# Patient Record
Sex: Female | Born: 1995
Health system: Southern US, Community
[De-identification: ages and names within clinical notes are randomized; demographics above are authoritative.]

## PROBLEM LIST (undated history)

## (undated) DIAGNOSIS — N939 Abnormal uterine and vaginal bleeding, unspecified: Principal | ICD-10-CM

## (undated) DIAGNOSIS — F32A Depression, unspecified: Secondary | ICD-10-CM

## (undated) DIAGNOSIS — B009 Herpesviral infection, unspecified: Secondary | ICD-10-CM

## (undated) DIAGNOSIS — F329 Major depressive disorder, single episode, unspecified: Secondary | ICD-10-CM

## (undated) DIAGNOSIS — R519 Headache, unspecified: Secondary | ICD-10-CM

## (undated) DIAGNOSIS — L209 Atopic dermatitis, unspecified: Secondary | ICD-10-CM

## (undated) HISTORY — DX: Atopic dermatitis, unspecified: L20.9

## (undated) HISTORY — DX: Headache, unspecified: R51.9

## (undated) HISTORY — DX: Herpesviral infection, unspecified: B00.9

## (undated) HISTORY — DX: Abnormal uterine and vaginal bleeding, unspecified: N93.9

---
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
## 2016-01-02 ENCOUNTER — Telehealth (HOSPITAL_COMMUNITY): Payer: Self-pay | Admitting: *Deleted

## 2016-02-10 ENCOUNTER — Ambulatory Visit (HOSPITAL_COMMUNITY): Payer: Self-pay | Admitting: Psychiatry

## 2016-03-04 ENCOUNTER — Encounter (HOSPITAL_COMMUNITY): Payer: Self-pay | Admitting: Psychiatry

## 2016-03-04 ENCOUNTER — Ambulatory Visit (INDEPENDENT_AMBULATORY_CARE_PROVIDER_SITE_OTHER): Payer: 59 | Admitting: Psychiatry

## 2016-03-04 DIAGNOSIS — F429 Obsessive-compulsive disorder, unspecified: Secondary | ICD-10-CM

## 2016-03-04 DIAGNOSIS — F411 Generalized anxiety disorder: Secondary | ICD-10-CM | POA: Diagnosis not present

## 2016-03-04 NOTE — Patient Instructions (Signed)
Discussed orally 
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
## 2016-03-10 ENCOUNTER — Encounter (HOSPITAL_COMMUNITY): Payer: Self-pay | Admitting: Psychiatry

## 2016-03-10 ENCOUNTER — Ambulatory Visit (INDEPENDENT_AMBULATORY_CARE_PROVIDER_SITE_OTHER): Payer: 59 | Admitting: Psychiatry

## 2016-03-10 DIAGNOSIS — F429 Obsessive-compulsive disorder, unspecified: Secondary | ICD-10-CM | POA: Diagnosis not present

## 2016-03-10 DIAGNOSIS — F411 Generalized anxiety disorder: Secondary | ICD-10-CM | POA: Diagnosis not present

## 2016-03-10 NOTE — Patient Instructions (Signed)
Discussed orally 
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
## 2016-03-11 ENCOUNTER — Encounter: Payer: Self-pay | Admitting: Family Medicine

## 2016-03-11 ENCOUNTER — Ambulatory Visit (INDEPENDENT_AMBULATORY_CARE_PROVIDER_SITE_OTHER): Payer: 59 | Admitting: Family Medicine

## 2016-03-11 VITALS — BP 118/78 | Ht 63.0 in | Wt 140.8 lb

## 2016-03-11 DIAGNOSIS — S060X1A Concussion with loss of consciousness of 30 minutes or less, initial encounter: Secondary | ICD-10-CM

## 2016-03-11 NOTE — Progress Notes (Signed)
   Subjective:    Patient ID: Brianna Banks, female    DOB: 05/30/1996, 20 y.o.   MRN: [id number]  HPI  Patient arrives after passing out and hitting her head after giving blood-year-old Patient states she gave blood. After doing so she went to the post office. She started finding herself feeling dizzy and lightheaded. In addition to this she felt hot and sweaty all over before she passed out. Before she could sit down she passed out striking her head against the door. She was out briefly. She has had some mild headache associated with the contusion. She denies global headache. She denies nausea or vomiting. She denies double vision. No unilateral numbness or weakness. Denies any seizure. Has never had this happen before but has had times where she is felt hot and sweaty and felt dizzy Review of Systems    see above. Denies fevers chills or recent illness. Family history noncontributory. Objective:   Physical Exam  Pupils responsive to light eardrums normal no blood behind eardrums throat is normal neck no masses neurologic finger to nose normal Romberg negative EOMI. Cranial nerves II through XII normal. Left posterior scalp has small hematoma. Neck no masses. No unilateral numbness or weakness      Assessment & Plan:  Concussion with loss of consciousness-patient fell hitting her head had loss consciousness that was brief patient with mild headache she was advised not to drive until headache resolves she was given work note If any vomiting double vision blurred vision or persistent headache go to ER Patient was encouraged to have her mother wake her up every 2 hours mother was present she is aware of this wake her up every 2 hours make sure that she has good mental focus. If any problems go to ER

## 2016-03-12 ENCOUNTER — Other Ambulatory Visit: Payer: Self-pay | Admitting: Nurse Practitioner

## 2016-03-19 ENCOUNTER — Ambulatory Visit (HOSPITAL_COMMUNITY): Payer: Self-pay | Admitting: Psychiatry

## 2016-03-26 ENCOUNTER — Ambulatory Visit (HOSPITAL_COMMUNITY): Payer: Self-pay | Admitting: Psychiatry

## 2016-04-02 ENCOUNTER — Ambulatory Visit (HOSPITAL_COMMUNITY): Payer: Self-pay | Admitting: Psychiatry

## 2016-04-07 ENCOUNTER — Telehealth: Payer: Self-pay | Admitting: Family Medicine

## 2016-04-07 ENCOUNTER — Encounter (HOSPITAL_COMMUNITY): Payer: Self-pay | Admitting: Psychiatry

## 2016-04-07 NOTE — Telephone Encounter (Signed)
Shot record printed.

## 2016-04-07 NOTE — Telephone Encounter (Signed)
Pt is needing a copy of her immunization record.

## 2016-05-22 ENCOUNTER — Ambulatory Visit (INDEPENDENT_AMBULATORY_CARE_PROVIDER_SITE_OTHER): Payer: 59 | Admitting: Nurse Practitioner

## 2016-05-22 ENCOUNTER — Encounter: Payer: Self-pay | Admitting: Nurse Practitioner

## 2016-05-22 VITALS — BP 114/74 | Ht 63.0 in | Wt 140.2 lb

## 2016-05-22 DIAGNOSIS — Z3009 Encounter for other general counseling and advice on contraception: Secondary | ICD-10-CM

## 2016-05-22 DIAGNOSIS — Z309 Encounter for contraceptive management, unspecified: Secondary | ICD-10-CM | POA: Diagnosis not present

## 2016-05-22 DIAGNOSIS — Z113 Encounter for screening for infections with a predominantly sexual mode of transmission: Secondary | ICD-10-CM

## 2016-05-22 DIAGNOSIS — Z23 Encounter for immunization: Secondary | ICD-10-CM | POA: Diagnosis not present

## 2016-05-22 MED ORDER — ETONOGESTREL-ETHINYL ESTRADIOL 0.12-0.015 MG/24HR VA RING: VAGINAL_RING | VAGINAL | 12 refills | Status: DC

## 2016-05-23 ENCOUNTER — Encounter: Payer: Self-pay | Admitting: Nurse Practitioner

## 2016-05-23 NOTE — Progress Notes (Signed)
Subjective:  Presents to discuss contraceptive options. Has trouble remembering oc's. Would like to try Nuvaring. Has a new sexual partner. Uses condoms but not consistently. No fever, pelvic pain or discharge.   Objective:   BP 114/74   Ht 5\' 3"  (1.6 m)   Wt 140 lb 4 oz (63.6 kg)   BMI 24.84 kg/m  NAD. Alert, oriented. Lungs clear. Heart RRR.   Assessment: Encounter for counseling regarding initiation of other contraceptive measure  Screen for STD (sexually transmitted disease) - Plan: Chlamydia/Gonococcus/Trichomonas, NAA  Need for vaccination - Plan: Flu Vaccine QUAD 36+ mos PF IM (Fluarix & Fluzone Quad PF)  Plan:  Meds ordered this encounter  Medications  . etonogestrel-ethinyl estradiol (NUVARING) 0.12-0.015 MG/24HR vaginal ring    Sig: Insert vaginally once a month.    Dispense:  1 each    Refill:  12    Order Specific Question:   Supervising Provider    Answer:   Merlyn AlbertLUKING, WILLIAM S [[id number]]   Discussed safe sex issues. Call back if any issues with Nuvaring. Patient has used in the past without difficulty.

## 2016-05-25 ENCOUNTER — Encounter: Payer: Self-pay | Admitting: Family Medicine

## 2016-05-25 ENCOUNTER — Encounter: Payer: Self-pay | Admitting: Nurse Practitioner

## 2016-05-26 LAB — CHLAMYDIA/GONOCOCCUS/TRICHOMONAS, NAA
Chlamydia by NAA: POSITIVE — AB
Gonococcus by NAA: NEGATIVE
Trich vag by NAA: NEGATIVE

## 2016-05-27 ENCOUNTER — Other Ambulatory Visit: Payer: Self-pay | Admitting: Nurse Practitioner

## 2016-05-27 ENCOUNTER — Encounter: Payer: Self-pay | Admitting: Nurse Practitioner

## 2016-05-27 MED ORDER — AZITHROMYCIN 250 MG PO TABS: ORAL_TABLET | ORAL | 0 refills | Status: DC

## 2016-05-28 ENCOUNTER — Encounter: Payer: Self-pay | Admitting: Nurse Practitioner

## 2016-05-29 ENCOUNTER — Encounter: Payer: Self-pay | Admitting: Nurse Practitioner

## 2016-05-31 ENCOUNTER — Encounter: Payer: Self-pay | Admitting: Nurse Practitioner

## 2016-06-02 ENCOUNTER — Encounter: Payer: Self-pay | Admitting: Nurse Practitioner

## 2016-06-02 ENCOUNTER — Other Ambulatory Visit: Payer: Self-pay | Admitting: Nurse Practitioner

## 2016-06-02 DIAGNOSIS — Z113 Encounter for screening for infections with a predominantly sexual mode of transmission: Secondary | ICD-10-CM

## 2016-06-04 ENCOUNTER — Other Ambulatory Visit: Payer: Self-pay | Admitting: Nurse Practitioner

## 2016-06-06 LAB — GC/CHLAMYDIA PROBE AMP
Chlamydia trachomatis, NAA: NEGATIVE
Neisseria gonorrhoeae by PCR: NEGATIVE

## 2016-06-08 ENCOUNTER — Encounter: Payer: Self-pay | Admitting: Nurse Practitioner

## 2016-06-10 ENCOUNTER — Encounter: Payer: Self-pay | Admitting: Nurse Practitioner

## 2016-06-10 ENCOUNTER — Other Ambulatory Visit: Payer: Self-pay | Admitting: Nurse Practitioner

## 2016-06-10 MED ORDER — METRONIDAZOLE 500 MG PO TABS: 500.0000 mg | ORAL_TABLET | Freq: Two times a day (BID) | ORAL | 0 refills | Status: DC

## 2016-06-10 NOTE — Telephone Encounter (Signed)
The results are in for the GC/CT probe done on 06/04/16

## 2016-06-22 ENCOUNTER — Encounter: Payer: Self-pay | Admitting: Nurse Practitioner

## 2016-06-22 NOTE — Telephone Encounter (Signed)
Please give the patient a school note as requested and work note

## 2016-06-23 ENCOUNTER — Encounter: Payer: Self-pay | Admitting: Family Medicine

## 2016-06-23 NOTE — Telephone Encounter (Signed)
Nurse's-the patient may have a school note. Also if she needs Zofran please send in Zofran 8 mg tablet one 3 times a day when necessary nausea #12 with one refill, if she needs ongoing medicine for nausea she should get the nausea checked out

## 2016-06-24 ENCOUNTER — Encounter: Payer: Self-pay | Admitting: Adult Health

## 2016-09-16 ENCOUNTER — Ambulatory Visit: Payer: 59 | Admitting: Nurse Practitioner

## 2016-09-25 ENCOUNTER — Encounter: Payer: Self-pay | Admitting: Nurse Practitioner

## 2016-09-28 ENCOUNTER — Encounter: Payer: Self-pay | Admitting: Family Medicine

## 2016-10-06 ENCOUNTER — Encounter: Payer: Self-pay | Admitting: Family Medicine

## 2016-10-08 ENCOUNTER — Encounter: Payer: Self-pay | Admitting: Nurse Practitioner

## 2016-10-15 ENCOUNTER — Ambulatory Visit (INDEPENDENT_AMBULATORY_CARE_PROVIDER_SITE_OTHER): Payer: 59 | Admitting: Family Medicine

## 2016-10-15 ENCOUNTER — Encounter: Payer: Self-pay | Admitting: Family Medicine

## 2016-10-15 VITALS — BP 118/78 | Ht 63.0 in | Wt 121.5 lb

## 2016-10-15 DIAGNOSIS — F411 Generalized anxiety disorder: Secondary | ICD-10-CM | POA: Diagnosis not present

## 2016-10-15 DIAGNOSIS — R634 Abnormal weight loss: Secondary | ICD-10-CM

## 2016-10-15 DIAGNOSIS — I951 Orthostatic hypotension: Secondary | ICD-10-CM

## 2016-10-15 MED ORDER — BUSPIRONE HCL 5 MG PO TABS: 5.0000 mg | ORAL_TABLET | Freq: Two times a day (BID) | ORAL | 2 refills | Status: DC

## 2016-10-15 NOTE — Progress Notes (Signed)
Subjective:    Patient ID: Brianna Banks, female    DOB: Sep 15, 1995, 21 y.o.   MRN: [id number]  HPI Patient in today with c/o dizzy spells , and passing out. Also has c/o decreased appetite.  States onset 2 weeks ago.  #1-patient has significant loss of weight over the past couple months. She admits to not eating as well as she should. She states she finds herself feeling stressed and anxious a lot. She denies feeling depressed. She works 20 hours a week she goes to school. She does have a guy that she talks to a lot but she is not dating anyone and she states she doesn't have many friends. She finds himself feeling anxious throughout the day for no particular reason but denies any panic attacks and states often when she tries to eat things she finds himself feeling nauseous after a few minutes and we'll stop eating. Over the past few weeks she is noticed that she's been getting weak when she stands up  #2-weakness when she stands up with intermittent dizzy spells feels like to room is closing in on her feels dizzy she states once again she does not eat and drink as much as she should she denies any severe headaches wheezing difficulty breathing denies vomiting does have some intermittent nausea. Denies any unilateral numbness weakness.  #3 syncope she did pass out once at home she found herself laying on the ground after she stood up she wasn't quite sure what causes that she denies any other particular troubles  PMH patient states she's been under a fair amount of stress. She also states at times she felt like she was overweight so she try to lose some weight  We did talk at length about eating habits it does not sound like patient has anorexia but does sound when she has subpar eating habits and recently the anxiousness is cause significant eating issues States no other concerns this visit.    Review of Systems Patient denies chest tightness pressure pain shortness of breath she does  relate some dizziness denies unilateral numbness weakness no vomiting or diarrhea but does have nausea    Objective:   Physical Exam  Patient does not appear toxic eardrums are normal throat is normal neck no masses lungs are clear no crackles heart is regular abdomen soft no guarding rebound blood pressure laying sitting standing does show some drop with standing but otherwise normal neurologic grossly normal      Assessment & Plan:  Weight loss-patient is having poor nutrition we will check some lab work she does need do a better job of eating I believe that her emotions and anxiety are coming into play and causing part of this I do not find evidence of depression I counseled the patient about importance of eating healthier and eating more frequently in larger quantities and we will need to see her back in 4-6 weeks  Orthostatic hypotension she ought to use some salt with her diet eat more drink more this should correct itself  Syncope related to orthostatic hypotension no intervention necessary no scans or MRI is necessary currently  Generalized anxiety disorder patient states that Zoloft causes nausea I don't recommend SSRI because of the nausea side effect plus also the risk of suicidal ideation I would recommend using low-dose BuSpar 5 mg 1 twice a day this is non-habit forming. Also recommended patient do counseling and have discussed this with her. The patient defers on counseling we will put together  some information and send to her regarding some self-help books.  I told the patient if she changes her mind regarding counseling p we will be happy to help set this up

## 2016-10-16 ENCOUNTER — Encounter: Payer: Self-pay | Admitting: Family Medicine

## 2016-10-16 LAB — HEPATIC FUNCTION PANEL
ALT: 25 IU/L (ref 0–32)
AST: 20 IU/L (ref 0–40)
Albumin: 4.3 g/dL (ref 3.5–5.5)
Alkaline Phosphatase: 56 IU/L (ref 39–117)
Bilirubin Total: 0.3 mg/dL (ref 0.0–1.2)
Bilirubin, Direct: 0.07 mg/dL (ref 0.00–0.40)
Total Protein: 8.1 g/dL (ref 6.0–8.5)

## 2016-10-16 LAB — CBC WITH DIFFERENTIAL/PLATELET
Basophils Absolute: 0 10*3/uL (ref 0.0–0.2)
Basos: 1 %
EOS (ABSOLUTE): 0.1 10*3/uL (ref 0.0–0.4)
Eos: 2 %
Hematocrit: 40.3 % (ref 34.0–46.6)
Hemoglobin: 12.7 g/dL (ref 11.1–15.9)
Immature Grans (Abs): 0 10*3/uL (ref 0.0–0.1)
Immature Granulocytes: 0 %
Lymphocytes Absolute: 2 10*3/uL (ref 0.7–3.1)
Lymphs: 29 %
MCH: 25.3 pg — ABNORMAL LOW (ref 26.6–33.0)
MCHC: 31.5 g/dL (ref 31.5–35.7)
MCV: 80 fL (ref 79–97)
Monocytes Absolute: 0.6 10*3/uL (ref 0.1–0.9)
Monocytes: 9 %
Neutrophils Absolute: 3.9 10*3/uL (ref 1.4–7.0)
Neutrophils: 59 %
Platelets: 234 10*3/uL (ref 150–379)
RBC: 5.01 x10E6/uL (ref 3.77–5.28)
RDW: 17.1 % — ABNORMAL HIGH (ref 12.3–15.4)
WBC: 6.7 10*3/uL (ref 3.4–10.8)

## 2016-10-16 LAB — BASIC METABOLIC PANEL
BUN/Creatinine Ratio: 11 (ref 9–23)
BUN: 8 mg/dL (ref 6–20)
CO2: 21 mmol/L (ref 18–29)
Calcium: 10 mg/dL (ref 8.7–10.2)
Chloride: 100 mmol/L (ref 96–106)
Creatinine, Ser: 0.75 mg/dL (ref 0.57–1.00)
GFR calc Af Amer: 134 (ref >59)
GFR calc non Af Amer: 116 (ref >59)
Glucose: 83 mg/dL (ref 65–99)
Potassium: 4.8 mmol/L (ref 3.5–5.2)
Sodium: 139 mmol/L (ref 134–144)

## 2016-10-16 LAB — PREALBUMIN: PREALBUMIN: 26 mg/dL (ref 14–35)

## 2016-10-30 ENCOUNTER — Emergency Department (HOSPITAL_COMMUNITY)
Admission: EM | Admit: 2016-10-30 | Discharge: 2016-10-31 | Disposition: A | Discharge status: Home / Self Care | Payer: 59 | Attending: Emergency Medicine | Admitting: Emergency Medicine

## 2016-10-30 ENCOUNTER — Encounter (HOSPITAL_COMMUNITY): Payer: Self-pay | Admitting: Emergency Medicine

## 2016-10-30 DIAGNOSIS — N390 Urinary tract infection, site not specified: Secondary | ICD-10-CM

## 2016-10-30 DIAGNOSIS — F329 Major depressive disorder, single episode, unspecified: Secondary | ICD-10-CM | POA: Diagnosis not present

## 2016-10-30 DIAGNOSIS — F32A Depression, unspecified: Secondary | ICD-10-CM

## 2016-10-30 DIAGNOSIS — Z79899 Other long term (current) drug therapy: Secondary | ICD-10-CM | POA: Diagnosis not present

## 2016-10-30 HISTORY — DX: Depression, unspecified: F32.A

## 2016-10-30 HISTORY — DX: Major depressive disorder, single episode, unspecified: F32.9

## 2016-10-30 LAB — CBC WITH DIFFERENTIAL/PLATELET
Basophils Absolute: 0.1 10*3/uL (ref 0.0–0.1)
Basophils Relative: 1 %
Eosinophils Absolute: 0 10*3/uL (ref 0.0–0.7)
Eosinophils Relative: 1 %
HCT: 39.4 % (ref 36.0–46.0)
Hemoglobin: 12.5 g/dL (ref 12.0–15.0)
Lymphocytes Relative: 27 %
Lymphs Abs: 2.2 10*3/uL (ref 0.7–4.0)
MCH: 26 pg (ref 26.0–34.0)
MCHC: 31.7 g/dL (ref 30.0–36.0)
MCV: 82.1 fL (ref 78.0–100.0)
Monocytes Absolute: 0.8 10*3/uL (ref 0.1–1.0)
Monocytes Relative: 10 %
Neutro Abs: 5 10*3/uL (ref 1.7–7.7)
Neutrophils Relative %: 62 %
Platelets: 158 10*3/uL (ref 150–400)
RBC: 4.8 MIL/uL (ref 3.87–5.11)
RDW: 16.5 % — ABNORMAL HIGH (ref 11.5–15.5)
WBC: 8.1 10*3/uL (ref 4.0–10.5)

## 2016-10-30 LAB — BASIC METABOLIC PANEL
Anion gap: 9 (ref 5–15)
BUN: 11 mg/dL (ref 6–20)
CO2: 25 mmol/L (ref 22–32)
Calcium: 9.3 mg/dL (ref 8.9–10.3)
Chloride: 104 mmol/L (ref 101–111)
Creatinine, Ser: 0.97 mg/dL (ref 0.44–1.00)
GFR calc Af Amer: >60 mL/min (ref >60)
GFR calc non Af Amer: >60 mL/min (ref >60)
Glucose, Bld: 75 mg/dL (ref 65–99)
Potassium: 3.6 mmol/L (ref 3.5–5.1)
Sodium: 138 mmol/L (ref 135–145)

## 2016-10-30 LAB — ETHANOL: Alcohol, Ethyl (B): <5 mg/dL (ref <5)

## 2016-10-30 NOTE — ED Triage Notes (Addendum)
Pt has dx of depression.  Rx'd buspar feb 22.  Pt has increased depression past week.  Lays in bed all day.  Will not eat or drink.  Has lost 20lbs in past month.  Denies si/hi

## 2016-10-30 NOTE — BH Assessment (Signed)
Pt has been recommended for d/c and to f/u with OPT resources. Spoke with Linda HedgesValinde and provided disposition recommendation and resources were sent to fax [phone] as requested.  Princess BruinsAquicha Duff, MSW, Theresia MajorsLCSWA

## 2016-10-30 NOTE — BH Assessment (Signed)
Tele Assessment Note   Brianna Banks is an 21 y.o. female who presents to the ED voluntarily accompanied by her mother. Pt reports she has been feeling depressed and isolated. Pt provided verbal consent for her mother to be present and provide information to the assessor during the assessment. Pt's mother reports the pt lives at home with her and she noticed the pt has been isolating herself, staying in her room, not eating, and states she feels her depression has increased over the past several weeks. Pt reports she feels her depression has increased after ending her relationship with her partner after 7 months. Pt reports her relationship used to be an outlet to her feelings because she had "a distraction" but since they broke up about a week ago, she has not been able to control her emotions.    Pt reports she has lost her appetite and lost 20 lbs recently due to not wanting to eat. Pt's mother reports the pt will eat 1 sandwich every few days. Pt reports she wakes up and lays in the bed for several hours until she gets out of bed. Pt's mother reports the pt has told her "she is not afraid to die and she thinks that death is like an outlet from her reality." Pt has received OPT treatment in the past but did not feel it was helpful because the counselor was suggesting things such as taking a cooking class or a sewing class to cope with her depression.    Pt denies SI or HI and denies AVH. Pt was asked if she has ever been exposed to trauma including any type of abuse, pt looked away and stated "no." During the assessment, pt was observed as biting her lip throughout the assessment. Pt reports she obsessively picks at her skin. Pt stated "if I see like a pimple or a bump on my arm or on someone else's body I have to pick at it and if they don't let me I get mad." Pt states she has sometimes done this to the point her body will bleed. Pt states she shakes her legs often and when she gets upset she bites  her lips and her body shakes uncontrollably. Pt denies any current or hx of SA.    Per Nira Conn, NP pt does not meet criteria for inpt treatment and recommends OPT referrals.   Diagnosis: Major Depressive D/O; OCD   Past Medical History:  Past Medical History:  Diagnosis Date   Abnormal uterine bleeding (AUB) 03/11/2015   Atopic dermatitis    Depression     Past Surgical History:  Procedure Laterality Date   TONSILECTOMY/ADENOIDECTOMY WITH MYRINGOTOMY  2013    Family History:  Family History  Problem Relation Age of Onset   Arthritis Mother    Other Mother     nerve damage in back   Hypertension Father    Other Father     back pain; nerve damage   Cancer Maternal Aunt    Other Maternal Aunt     horse shoe kidney   Bipolar disorder Maternal Aunt    Depression Maternal Aunt    Cancer Maternal Uncle    Diabetes Paternal Aunt    COPD Paternal Aunt    Arthritis Maternal Grandmother    Hypertension Maternal Grandmother    COPD Maternal Grandmother    Diabetes Maternal Grandmother    Depression Maternal Grandmother    Stroke Maternal Grandmother    Other Maternal Grandmother  restless leg; neuropathy; adrenal gland stopped working; B12 def.   Cancer Maternal Grandfather    Cancer Paternal Grandmother    GER disease Paternal Grandmother    Hypertension Paternal Grandmother    Diabetes Paternal Grandfather    Heart attack Paternal Grandfather    Other Paternal Grandfather     brain aneursym   Depression Maternal Aunt    Diabetes Maternal Aunt     Prediabetes   Other Maternal Aunt     restless leg   Bipolar disorder Maternal Aunt     Social History:  reports that she has never smoked. She has never used smokeless tobacco. She reports that she does not drink alcohol or use drugs.  Additional Social History:  Alcohol / Drug Use Pain Medications: See PTA meds  Prescriptions: See PTA meds  Over the Counter: See PTA meds   History of alcohol / drug use?: No history of alcohol / drug abuse  CIWA: CIWA-Ar BP: 113/72 Pulse Rate: 102 COWS:    PATIENT STRENGTHS: (choose at least two) Average or above average intelligence Capable of independent living Communication skills Financial means General fund of knowledge Motivation for treatment/growth Physical Health Supportive family/friends  Allergies:  Allergies  Allergen Reactions   Aspirin Hives    No ASA based products   Cefprozil Rash    Childhood-Cefzil    Home Medications:  (Not in a hospital admission)  OB/GYN Status:  Patient's last menstrual period was 10/22/2016.  General Assessment Data Location of Assessment: [facility] TTS Assessment: In system Is this a Tele or Face-to-Face Assessment?: Tele Assessment Is this an Initial Assessment or a Re-assessment for this encounter?: Initial Assessment Marital status: Single Is patient pregnant?: No Pregnancy Status: No Living Arrangements: Parent Can pt return to current living arrangement?: Yes Admission Status: Voluntary Is patient capable of signing voluntary admission?: Yes Referral Source: Self/Family/Friend Insurance type: [facility]     Crisis Care Plan Living Arrangements: Parent Name of Psychiatrist: NONE Name of Therapist: NONE  Education Status Is patient currently in school?: Yes Current Grade: sophmore in college  Highest grade of school patient has completed: some college  Name of school: [employer]  Risk to self with the past 6 months Suicidal Ideation: No Has patient been a risk to self within the past 6 months prior to admission? : No Suicidal Intent: No Has patient had any suicidal intent within the past 6 months prior to admission? : No Is patient at risk for suicide?: No Suicidal Plan?: No Has patient had any suicidal plan within the past 6 months prior to admission? : No Access to Means: No What has been your use of drugs/alcohol within the last 12 months?: denies use   Previous Attempts/Gestures: No Triggers for Past Attempts: None known Intentional Self Injurious Behavior: Bruising Comment - Self Injurious Behavior: pt reports she picks at her skin until the point that it bleeds  Family Suicide History: No Recent stressful life event(s): Loss (Comment) (reports she ended a relationship with her boyfriend ) Persecutory voices/beliefs?: No Depression: Yes Depression Symptoms: Despondent, Insomnia, Isolating, Fatigue, Loss of interest in usual pleasures, Feeling worthless/self pity Substance abuse history and/or treatment for substance abuse?: No Suicide prevention information given to non-admitted patients: Not applicable  Risk to Others within the past 6 months Homicidal Ideation: No Does patient have any lifetime risk of violence toward others beyond the six months prior to admission? : No Thoughts of Harm to Others: No Current Homicidal Intent: No Current Homicidal Plan: No  Access to Homicidal Means: No History of harm to others?: No Assessment of Violence: None Noted Does patient have access to weapons?: No Criminal Charges Pending?: No Does patient have a court date: No Is patient on probation?: No  Psychosis Hallucinations: None noted Delusions: None noted  Mental Status Report Appearance/Hygiene: In scrubs, Unremarkable Eye Contact: Good Motor Activity: Freedom of movement Speech: Logical/coherent Level of Consciousness: Alert Mood: Depressed, Sullen, Sad, Helpless Affect: Depressed, Flat, Sad Anxiety Level: None Thought Processes: Coherent, Relevant Judgement: Partial Orientation: Person, Place, Time, Situation, Appropriate for developmental age Obsessive Compulsive Thoughts/Behaviors: Moderate  Cognitive Functioning Concentration: Normal Memory: Recent Intact, Remote Intact IQ: Average Insight: Fair Impulse Control: Good Appetite: Poor Weight Loss: 20 Sleep: Decreased Total Hours of Sleep: 4 Vegetative Symptoms:  Staying in bed  ADLScreening [facility]) Patient's cognitive ability adequate to safely complete daily activities?: Yes Patient able to express need for assistance with ADLs?: Yes Independently performs ADLs?: Yes (appropriate for developmental age)  Prior Inpatient Therapy Prior Inpatient Therapy: No  Prior Outpatient Therapy Prior Outpatient Therapy: Yes Prior Therapy Dates: 2017 Prior Therapy Facilty/Provider(s): Pt unable to recall the name of the OPT provider  Reason for Treatment: MDD Does patient have an ACCT team?: No Does patient have Intensive In-House Services?  : No Does patient have [facility] services? : No Does patient have P4CC services?: No  ADL Screening (condition at time of admission) Patient's cognitive ability adequate to safely complete daily activities?: Yes Is the patient deaf or have difficulty hearing?: No Does the patient have difficulty seeing, even when wearing glasses/contacts?: No Does the patient have difficulty concentrating, remembering, or making decisions?: No Patient able to express need for assistance with ADLs?: Yes Does the patient have difficulty dressing or bathing?: No Independently performs ADLs?: Yes (appropriate for developmental age) Does the patient have difficulty walking or climbing stairs?: No Weakness of Legs: None Weakness of Arms/Hands: None  Home Assistive Devices/Equipment Home Assistive Devices/Equipment: None    Abuse/Neglect Assessment (Assessment to be complete while patient is alone) Physical Abuse: Denies Verbal Abuse: Denies Sexual Abuse: Denies Exploitation of patient/patient's resources: Denies Self-Neglect: Denies     Merchant navy officerAdvance Directives (For Healthcare) Does Patient Have a Medical Advance Directive?: No Would patient like information on creating a medical advance directive?: No - Patient declined    Additional Information 1:1 In Past 12 Months?: No CIRT Risk: No Elopement Risk: No Does  patient have medical clearance?: Yes     Disposition:  Disposition Initial Assessment Completed for this Encounter: Yes Disposition of Patient: Outpatient treatment Type of outpatient treatment: Adult (per Nira ConnJason Berry, NP resources provided )  Brianna Banks 10/30/2016 11:46 PM

## 2016-10-30 NOTE — ED Provider Notes (Signed)
[facility] Provider Note   CSN: [id number] Arrival date & time: 10/30/16  1928     History   Chief Complaint Chief Complaint  Patient presents with  . Depression    HPI Brianna Banks is a 21 y.o. female.  HPI  Pt was seen at 2000. Per pt, c/o gradual onset and worsening of persistent depression for the past month, worse over the past week. Pt states she has been "just laying in bed" and "not eating." PMD rx buspar 10/15/16 for her symptoms. Pt states she does not believe the medication has improved her symptoms. Denies SI, no SA, no HI, no hallucinations.   Past Medical History:  Diagnosis Date  . Abnormal uterine bleeding (AUB) 03/11/2015  . Atopic dermatitis   . Depression     Patient Active Problem List   Diagnosis Date Noted  . Anxiety 05/15/2015  . OCD (obsessive compulsive disorder) 05/15/2015  . Abnormal uterine bleeding (AUB) 03/11/2015    Past Surgical History:  Procedure Laterality Date  . TONSILECTOMY/ADENOIDECTOMY WITH MYRINGOTOMY  2013    OB History    Gravida Para Term Preterm AB Living   0 0 0 0 0 0   SAB TAB Ectopic Multiple Live Births   0 0 0 0         Home Medications    Prior to Admission medications   Medication Sig Start Date End Date Taking? Authorizing Provider  busPIRone (BUSPAR) 5 MG tablet Take 1 tablet (5 mg total) by mouth 2 (two) times daily. 10/15/16  Yes Babs SciaraScott A Luking, MD  etonogestrel-ethinyl estradiol (NUVARING) 0.12-0.015 MG/24HR vaginal ring Insert vaginally once a month. 05/22/16  Yes Campbell Richesarolyn C Hoskins, NP    Family History Family History  Problem Relation Age of Onset  . Arthritis Mother   . Other Mother     nerve damage in back  . Hypertension Father   . Other Father     back pain; nerve damage  . Cancer Maternal Aunt   . Other Maternal Aunt     horse shoe kidney  . Bipolar disorder Maternal Aunt   . Depression Maternal Aunt   . Cancer Maternal Uncle   . Diabetes Paternal Aunt   . COPD Paternal  Aunt   . Arthritis Maternal Grandmother   . Hypertension Maternal Grandmother   . COPD Maternal Grandmother   . Diabetes Maternal Grandmother   . Depression Maternal Grandmother   . Stroke Maternal Grandmother   . Other Maternal Grandmother     restless leg; neuropathy; adrenal gland stopped working; B12 def.  . Cancer Maternal Grandfather   . Cancer Paternal Grandmother   . GER disease Paternal Grandmother   . Hypertension Paternal Grandmother   . Diabetes Paternal Grandfather   . Heart attack Paternal Grandfather   . Other Paternal Grandfather     brain aneursym  . Depression Maternal Aunt   . Diabetes Maternal Aunt     Prediabetes  . Other Maternal Aunt     restless leg  . Bipolar disorder Maternal Aunt     Social History Social History  Substance Use Topics  . Smoking status: Never Smoker  . Smokeless tobacco: Never Used  . Alcohol use No     Allergies   Aspirin and Cefprozil   Review of Systems Review of Systems ROS: Statement: All systems negative except as marked or noted in the HPI; Constitutional: Negative for fever and chills. ; ; Eyes: Negative for eye pain, redness and discharge. ; ;  ENMT: Negative for ear pain, hoarseness, nasal congestion, sinus pressure and sore throat. ; ; Cardiovascular: Negative for chest pain, palpitations, diaphoresis, dyspnea and peripheral edema. ; ; Respiratory: Negative for cough, wheezing and stridor. ; ; Gastrointestinal: Negative for nausea, vomiting, diarrhea, abdominal pain, blood in stool, hematemesis, jaundice and rectal bleeding. . ; ; Genitourinary: Negative for dysuria, flank pain and hematuria. ; ; Musculoskeletal: Negative for back pain and neck pain. Negative for swelling and trauma.; ; Skin: Negative for pruritus, rash, abrasions, blisters, bruising and skin lesion.; ; Neuro: Negative for headache, lightheadedness and neck stiffness. Negative for weakness, altered level of consciousness, altered mental status, extremity  weakness, paresthesias, involuntary movement, seizure and syncope.; Psych:  +depression. No SI, no SA, no HI, no hallucinations.    Physical Exam Updated Vital Signs BP 113/72 (BP Location: Right Arm)   Pulse 102   Temp 98.9 F (37.2 C) (Oral)   Resp 18   Ht 5\' 3"  (1.6 m)   Wt 120 lb (54.4 kg)   LMP 10/22/2016   SpO2 100%   BMI 21.26 kg/m   Physical Exam 2005: Physical examination:  Nursing notes reviewed; Vital signs and O2 SAT reviewed;  Constitutional: Well developed, Well nourished, Well hydrated, In no acute distress; Head:  Normocephalic, atraumatic; Eyes: EOMI, PERRL, No scleral icterus; ENMT: Mouth and pharynx normal, Mucous membranes moist; Neck: Supple, Full range of motion; Cardiovascular: Regular rate and rhythm; Respiratory: Breath sounds clear, No wheezes.  Speaking full sentences with ease, Normal respiratory effort/excursion; Chest: No deformity, Movement normal; Abdomen: Nondistended; Extremities: No deformity.; Neuro: AA&Ox3, Major CN grossly intact.  Speech clear. No gross focal motor deficits in extremities. Climbs on and off stretcher easily by herself. Gait steady.; Skin: Color normal, Warm, Dry.; Psych:  Affect flat.     ED Treatments / Results  Labs (all labs ordered are listed, but only abnormal results are displayed)   EKG  EKG Interpretation None       Radiology   Procedures Procedures (including critical care time)  Medications Ordered in ED Medications - No data to display   Initial Impression / Assessment and Plan / ED Course  I have reviewed the triage vital signs and the nursing notes.  Pertinent labs & imaging results that were available during my care of the patient were reviewed by me and considered in my medical decision making (see chart for details).  MDM Reviewed: previous chart, nursing note and vitals Reviewed previous: labs Interpretation: labs   Results for orders placed or performed during the hospital encounter of  10/30/16  Ethanol  Result Value Ref Range   Alcohol, Ethyl (B) <5 <5 mg/dL  Basic metabolic panel  Result Value Ref Range   Sodium 138 135 - 145 mmol/L   Potassium 3.6 3.5 - 5.1 mmol/L   Chloride 104 101 - 111 mmol/L   CO2 25 22 - 32 mmol/L   Glucose, Bld 75 65 - 99 mg/dL   BUN 11 6 - 20 mg/dL   Creatinine, Ser 2.84 0.44 - 1.00 mg/dL   Calcium 9.3 8.9 - 13.2 mg/dL   GFR calc non Af Amer >60 >60 mL/min   GFR calc Af Amer >60 >60 mL/min   Anion gap 9 5 - 15  CBC with Differential  Result Value Ref Range   WBC 8.1 4.0 - 10.5 K/uL   RBC 4.80 3.87 - 5.11 MIL/uL   Hemoglobin 12.5 12.0 - 15.0 g/dL   HCT 44.0 10.2 - 72.5 %  MCV 82.1 78.0 - 100.0 fL   MCH 26.0 26.0 - 34.0 pg   MCHC 31.7 30.0 - 36.0 g/dL   RDW 16.1 (H) 09.6 - 04.5 %   Platelets 158 150 - 400 K/uL   Neutrophils Relative % 62 %   Neutro Abs 5.0 1.7 - 7.7 K/uL   Lymphocytes Relative 27 %   Lymphs Abs 2.2 0.7 - 4.0 K/uL   Monocytes Relative 10 %   Monocytes Absolute 0.8 0.1 - 1.0 K/uL   Eosinophils Relative 1 %   Eosinophils Absolute 0.0 0.0 - 0.7 K/uL   Basophils Relative 1 %   Basophils Absolute 0.1 0.0 - 0.1 K/uL    2205:  UDS and TTS consult pending. Sign out to oncoming provider.     Final Clinical Impressions(s) / ED Diagnoses   Final diagnoses:  None    New Prescriptions New Prescriptions   No medications on file     Samuel Jester, DO 10/30/16 2208

## 2016-10-31 LAB — RAPID URINE DRUG SCREEN, HOSP PERFORMED
Amphetamines: NOT DETECTED
Barbiturates: NOT DETECTED
Benzodiazepines: NOT DETECTED
Cocaine: NOT DETECTED
Opiates: NOT DETECTED
Tetrahydrocannabinol: NOT DETECTED

## 2016-10-31 LAB — URINALYSIS, ROUTINE W REFLEX MICROSCOPIC
Bilirubin Urine: NEGATIVE
Glucose, UA: NEGATIVE mg/dL
Ketones, ur: 5 mg/dL — AB
Nitrite: NEGATIVE
Protein, ur: 30 mg/dL — AB
Specific Gravity, Urine: 1.029 (ref 1.005–1.030)
pH: 5 (ref 5.0–8.0)

## 2016-10-31 LAB — PREGNANCY, URINE: Preg Test, Ur: NEGATIVE

## 2016-10-31 MED ORDER — NITROFURANTOIN MONOHYD MACRO 100 MG PO CAPS: 100.0000 mg | ORAL_CAPSULE | Freq: Two times a day (BID) | ORAL | 0 refills | Status: DC

## 2016-10-31 NOTE — ED Provider Notes (Signed)
Patient has been seen by TTS. She does not meet inpatient criteria. She is able to contract for safety and is given outpatient resources. She has no suicidal or homicidal thoughts at this time. No hallucinations. Parents are comfortable taking her home. Treat UTI  BP 96/60 (BP Location: Right Arm)   Pulse 72   Temp 98.2 F (36.8 C) (Oral)   Resp 18   Ht 5\' 3"  (1.6 m)   Wt 120 lb (54.4 kg)   LMP 10/22/2016   SpO2 100%   BMI 21.26 kg/m     Glynn OctaveStephen Ezriel Boffa, MD 10/31/16 0124

## 2016-10-31 NOTE — Discharge Instructions (Signed)
Follow up with a therapist from the resources provided. Return to the ED if you develop thoughts of wanting to hurt yourself or anyone else.

## 2016-11-18 ENCOUNTER — Encounter: Payer: Self-pay | Admitting: Nurse Practitioner

## 2016-11-23 ENCOUNTER — Other Ambulatory Visit: Payer: Self-pay | Admitting: Nurse Practitioner

## 2016-11-23 MED ORDER — IVERMECTIN 0.5 % EX LOTN: TOPICAL_LOTION | CUTANEOUS | 0 refills | Status: DC

## 2016-11-25 ENCOUNTER — Encounter: Payer: Self-pay | Admitting: Nurse Practitioner

## 2016-11-26 ENCOUNTER — Ambulatory Visit: Payer: 59 | Admitting: Family Medicine

## 2016-11-27 ENCOUNTER — Ambulatory Visit (INDEPENDENT_AMBULATORY_CARE_PROVIDER_SITE_OTHER): Payer: 59 | Admitting: Family Medicine

## 2016-11-27 VITALS — BP 108/64 | Ht 63.0 in | Wt 116.0 lb

## 2016-11-27 DIAGNOSIS — F411 Generalized anxiety disorder: Secondary | ICD-10-CM

## 2016-11-27 MED ORDER — BUSPIRONE HCL 5 MG PO TABS: 5.0000 mg | ORAL_TABLET | Freq: Two times a day (BID) | ORAL | 4 refills | Status: DC

## 2016-11-27 NOTE — Progress Notes (Signed)
   Subjective:    Patient ID: Brianna Banks, female    DOB: 1996/01/27, 21 y.o.   MRN: [id number]  HPIFollow up anxiety. Eating more now, feeling better. Taking buspar  bid. Pt states no concerns.  Patient denies being depressed. She states her moods are neutral. She denies feeling down she does get anxious at times no panic attacks no vomiting wheezing fevers chills no unusual symptoms lately   Review of Systems  Constitutional: Negative for activity change, fatigue and fever.  Respiratory: Negative for cough and shortness of breath.   Cardiovascular: Negative for chest pain and leg swelling.  Neurological: Negative for headaches.       Objective:   Physical Exam  Constitutional: She appears well-nourished. No distress.  HENT:  Head: Normocephalic.  Cardiovascular: Normal rate, regular rhythm and normal heart sounds.   No murmur heard. Pulmonary/Chest: Effort normal and breath sounds normal.  Musculoskeletal: She exhibits no edema.  Lymphadenopathy:    She has no cervical adenopathy.  Neurological: She is alert.  Psychiatric: Her behavior is normal.  Vitals reviewed.  Not currently dating but she is friends with the boy she broke up with  The patient is trying to look at her major in college she may be shifting it to something she likes better     Assessment & Plan:  Long discussion held with patient regarding anxiety how to manage anxiousness and how to stay in gauge with keeping busy with friends and school etc. she is working a job which will be helpful. Patient not depressed. She states she is benefiting from the medicine. I did encourage her to see a counselor at [facility] she thinks about it at this time she will follow-up with [place] in 4 months sooner if any problems

## 2016-12-15 ENCOUNTER — Ambulatory Visit: Payer: 59 | Admitting: Adult Health

## 2016-12-22 ENCOUNTER — Encounter: Payer: Self-pay | Admitting: Adult Health

## 2016-12-22 ENCOUNTER — Ambulatory Visit (INDEPENDENT_AMBULATORY_CARE_PROVIDER_SITE_OTHER): Payer: 59 | Admitting: Adult Health

## 2016-12-22 VITALS — BP 96/60 | HR 86 | Ht 63.0 in | Wt 115.0 lb

## 2016-12-22 DIAGNOSIS — N941 Unspecified dyspareunia: Secondary | ICD-10-CM | POA: Diagnosis not present

## 2016-12-22 NOTE — Patient Instructions (Signed)
Dyspareunia, Female Dyspareunia is pain that is associated with sexual activity. This can affect any part of the genitals or lower abdomen, and there are many possible causes. This condition ranges from mild to severe. Depending on the cause, dyspareunia may get better with treatment, or it may return (recur) over time. What are the causes? The cause of this condition is not always known. Possible causes include:  Cancer.  Psychological factors, such as depression, anxiety, or previous traumatic experiences.  Severe pain and tenderness of the skin around the vagina (vulva) when it is touched (vulvar vestibulitis syndrome).  Infection of the pelvis or the vulva.  Infection of the vagina.  Painful, involuntary tightening (contraction) of the vaginal muscles when anything is put inside the vagina (vaginismus).  Allergic reaction.  Ovarian cysts.  Solid growths of tissue (tumors) in the ovaries or the uterus.  Scar tissue in the ovaries, vagina, or pelvis.  Vaginal dryness.  Thinning of the tissue (atrophy) of the vulva and vagina.  Skin conditions that affect the vulva (vulvar dermatoses), such as lichen sclerosus or lichen planus.  Endometriosis.  Tubal pregnancy.  A tilted uterus.  Uterine prolapse.  Adhesions in the vagina.  Bladder problems.  Intestinal problems.  Certain medicines.  Medical conditions such as diabetes, arthritis, or thyroid disease. What increases the risk? The following factors may make you more likely to develop this condition:  Having experienced physical or sexual trauma.  Having given birth more than once.  Taking birth control pills.  Having gone through menopause.  Having recently given birth, typically within the past 3-6 months.  Breastfeeding. What are the signs or symptoms? The main symptom of this condition is pain in any part of the genitals or lower abdomen during or after sexual activity. This may include pain during  sexual arousal, genital stimulation, or orgasm. Pain may get worse when anything is inserted into the vagina, or when the genitals are touched in any way, such as when sitting or wearing pants. Pain can range from mild to severe, depending on the cause of the condition. In some cases, symptoms go away with treatment and return (recur) at a later date. How is this diagnosed? This condition may be diagnosed based on:  Your symptoms, including:  Where your pain is located.  When your pain occurs.  Your medical history.  A physical exam. This may include a pelvic exam and a Pap test. This is a screening test that is used to check for signs of cancer of the vagina, cervix, and uterus.  Tests, including:  Blood tests.  Ultrasound. This uses sound waves to make a picture of the area that is being tested.  Urine culture. This test involves checking a urine sample for signs of infection.  Culture test. This is when your health care provider uses a swab to collect a sample of vaginal fluid. The sample is checked for signs of infection.  X-rays.  MRI.  CT scan.  Laparoscopy. This is a procedure in which a small incision is made in your lower abdomen and a lighted, pencil-sized instrument (laparoscope) is passed through the incision and used to look inside your pelvis. You may be referred to a health care provider who specializes in women's health (gynecologist). In some cases, diagnosing the cause of dyspareunia can be difficult. How is this treated? Treatment depends on the cause of your condition and your symptoms. In most cases, you may need to stop sexual activity until your symptoms improve. Treatment may   include:  Lubricants.  Kegel exercises or vaginal dilators.  Medicated skin creams.  Medicated vaginal creams.  Hormonal therapy.  Antibiotic medicine to prevent or fight infection.  Medicines that help to relieve pain.  Medicines that treat depression  (antidepressants).  Psychological counseling.  Sex therapy.  Surgery. Follow these instructions at home: Lifestyle   Avoid tight clothing and irritating materials around your genital and abdominal area.  Use water-based lubricants as needed. Avoid oil-based lubricants.  Do not use any products that irritate you. This may include certain condoms, spermicides, lubricants, soaps, tampons, vaginal sprays, or douches.  Always practice safe sex. Talk with your health care provider about which form of birth control (contraception) is best for you.  Maintain open communication with your sexual partner. General instructions   Take over-the-counter and prescription medicines only as told by your health care provider.  If you had tests done, it is your responsibility to get your tests results. Ask your health care provider or the department performing the test when your results will be ready.  Urinate before you engage in sexual activity.  Consider joining a support group.  Keep all follow-up visits as told by your health care provider. This is important. Contact a health care provider if:  You develop vaginal bleeding after sexual intercourse.  You develop a lump at the opening of your vagina. Seek medical care even if the lump is painless.  You have:  Abnormal vaginal discharge.  Vaginal dryness.  Itchiness or irritation of your vulva or vagina.  A new rash.  Symptoms that get worse or do not improve with treatment.  A fever.  Pain when you urinate.  Blood in your urine. Get help right away if:  You develop severe pain in your abdomen during or shortly after sexual intercourse.  You pass out after having sexual intercourse. This information is not intended to replace advice given to you by your health care provider. Make sure you discuss any questions you have with your health care provider. Document Released: 08/30/2007 Document Revised: 12/20/2015 Document  Reviewed: 03/12/2015 Elsevier Interactive Patient Education  2017 Elsevier Inc.  

## 2016-12-22 NOTE — Progress Notes (Signed)
Subjective:     Patient ID: Brianna Banks, female   DOB: 1996/05/09, 21 y.o.   MRN: [id number]  HPI Sandia is a 21 year old white female in with partner complaining of pain with sex, esp on right side of vagina.   Review of Systems Pain with sex esp right side of vagina Reviewed past medical,surgical, social and family history. Reviewed medications and allergies.     Objective:   Physical Exam BP 96/60 (BP Location: Right Arm, Patient Position: Sitting, Cuff Size: Normal)   Pulse 86   Ht  (1.6 m)   Wt 115 lb (52.2 kg)   LMP 12/19/2016   BMI 20.37 kg/m  Skin warm and dry.Pelvic: external genitalia is normal in appearance no lesions, vagina: pink with good moisture and rugae,urethra has no lesions or masses noted, cervix:smooth, uterus: normal size, shape and contour, non tender, no masses felt, adnexa: no masses or tenderness noted. Bladder is non tender and no masses felt.    Assessment:     1. Dyspareunia in female       Plan:    Increase foreplay Use good lubricate Try different positions Talk to each other Follow up prn

## 2017-01-04 ENCOUNTER — Encounter: Payer: Self-pay | Admitting: Family Medicine

## 2017-01-04 ENCOUNTER — Encounter: Payer: Self-pay | Admitting: Nurse Practitioner

## 2017-01-15 ENCOUNTER — Ambulatory Visit (INDEPENDENT_AMBULATORY_CARE_PROVIDER_SITE_OTHER): Payer: 59 | Admitting: Family Medicine

## 2017-01-15 ENCOUNTER — Encounter: Payer: Self-pay | Admitting: Family Medicine

## 2017-01-15 VITALS — BP 104/74 | Ht 63.0 in | Wt 115.2 lb

## 2017-01-15 DIAGNOSIS — N941 Unspecified dyspareunia: Secondary | ICD-10-CM

## 2017-01-15 DIAGNOSIS — R634 Abnormal weight loss: Secondary | ICD-10-CM

## 2017-01-15 DIAGNOSIS — I951 Orthostatic hypotension: Secondary | ICD-10-CM

## 2017-01-15 DIAGNOSIS — R5383 Other fatigue: Secondary | ICD-10-CM

## 2017-01-15 NOTE — Progress Notes (Signed)
   Subjective:    Patient ID: Lajean ManesBrianna H Bolser, female    DOB: 12/04/1995, 21 y.o.   MRN: [id number]  HPI  Patient in today to discuss weight loss. Patient states that she has had a decreased appetite.   Also has concerns of bruising, and extreme fatigue. Onset 2-3 months.  Patient and significant fatigue tiredness sleeps over 10+ hours per day lays in bed a lot denies being depressed denies being anxious has had some history of depression and anxiousness. In addition to this has had decreased appetite does not eat much for breakfast at all in eats small amount for meals she does not consider herself fat. She relates feeling fatigue she relates getting dizzy when she stands up denies any chest pains denies sweats chills fevers vomiting diarrhea. PMH benign family history benign Review of Systems Please see above denies headache neck pain denies difficulty swallowing no chest tightness pressure pain shortness of breath no vomiting diarrhea no fever chills sweats no joint pain no muscle aches    Objective:   Physical Exam HEENT is benign neck no masses lungs are clear no crackles heart is regular abdomen soft no guarding rebound or tenderness   25 minutes was spent with the patient. Greater than half the time was spent in discussion and answering questions and counseling regarding the issues that the patient came in for today.     Assessment & Plan:  Weight loss-I don't find any evidence of underlying disease entities but we will check some lab work  Orthostatic hypotension poor eating habits she will fill out some questionnaires regarding depression and anxiety and diet  Fatigue check some lab work await the results  Dyspareunia-I recommend the patient get a consultation with female gynecologist I gave her the name of Dr[facility]ody [facility]  I did give the patient some paperwork to do she will fill these out she will send them back to us to give us a better understanding  of her dietary history and also how her moods and anxiousness is doing

## 2017-01-16 LAB — CBC WITH DIFFERENTIAL/PLATELET
Basophils Absolute: 0 10*3/uL (ref 0.0–0.2)
Basos: 1 %
EOS (ABSOLUTE): 0.1 10*3/uL (ref 0.0–0.4)
Eos: 2 %
Hematocrit: 41.4 % (ref 34.0–46.6)
Hemoglobin: 12.8 g/dL (ref 11.1–15.9)
Immature Grans (Abs): 0 10*3/uL (ref 0.0–0.1)
Immature Granulocytes: 0 %
Lymphocytes Absolute: 2.3 10*3/uL (ref 0.7–3.1)
Lymphs: 38 %
MCH: 26.7 pg (ref 26.6–33.0)
MCHC: 30.9 g/dL — ABNORMAL LOW (ref 31.5–35.7)
MCV: 86 fL (ref 79–97)
Monocytes Absolute: 0.4 10*3/uL (ref 0.1–0.9)
Monocytes: 7 %
Neutrophils Absolute: 3.2 10*3/uL (ref 1.4–7.0)
Neutrophils: 52 %
Platelets: 222 10*3/uL (ref 150–379)
RBC: 4.8 x10E6/uL (ref 3.77–5.28)
RDW: 15.3 % (ref 12.3–15.4)
WBC: 6.1 10*3/uL (ref 3.4–10.8)

## 2017-01-16 LAB — IRON AND TIBC
Iron Saturation: 8 % — CL (ref 15–55)
Iron: 35 ug/dL (ref 27–159)
Total Iron Binding Capacity: 437 ug/dL (ref 250–450)
UIBC: 402 ug/dL (ref 131–425)

## 2017-01-16 LAB — PT AND PTT
INR: 0.9 (ref 0.8–1.2)
Prothrombin Time: 10.1 s (ref 9.1–12.0)
aPTT: 26 s (ref 24–33)

## 2017-01-16 LAB — TSH: TSH: 1.68 u[IU]/mL (ref 0.450–4.500)

## 2017-01-16 LAB — HIV ANTIBODY (ROUTINE TESTING W REFLEX): HIV Screen 4th Generation wRfx: NONREACTIVE

## 2017-01-16 LAB — HEPATIC FUNCTION PANEL
ALT: 12 IU/L (ref 0–32)
AST: 15 IU/L (ref 0–40)
Albumin: 4.2 g/dL (ref 3.5–5.5)
Alkaline Phosphatase: 51 IU/L (ref 39–117)
Bilirubin Total: 0.2 mg/dL (ref 0.0–1.2)
Bilirubin, Direct: 0.08 mg/dL (ref 0.00–0.40)
Total Protein: 7.3 g/dL (ref 6.0–8.5)

## 2017-01-16 LAB — FERRITIN: Ferritin: 7 ng/mL — ABNORMAL LOW (ref 15–77)

## 2017-01-16 LAB — CORTISOL: Cortisol: 16.1 ug/dL

## 2017-01-16 LAB — T4, FREE: Free T4: 1.26 ng/dL (ref 0.93–1.60)

## 2017-01-20 ENCOUNTER — Telehealth: Payer: Self-pay | Admitting: Family Medicine

## 2017-01-20 DIAGNOSIS — R634 Abnormal weight loss: Secondary | ICD-10-CM

## 2017-01-20 NOTE — Telephone Encounter (Signed)
Pt dropped off a food diary. Diary is in nurse box.

## 2017-01-21 ENCOUNTER — Encounter: Payer: Self-pay | Admitting: Family Medicine

## 2017-01-21 NOTE — Telephone Encounter (Signed)
Patient advised Dr Lorin PicketScott  reviewed over her depression and anxiety questionnaire both of these are considered negative. Dr Lorin PicketScott would recommend a standard follow-up of visit in 8 weeks to see how her weight is doing. Dr Lorin PicketScott also reviewed the dietary log and advises patient to keep her weight steady and recommends visit with dietician. Patient verbalized understanding and  Agreed to visit with dietician. Referral ordered in EPIC.

## 2017-01-21 NOTE — Telephone Encounter (Signed)
Keep her weight steady I believe she would benefit from a nutritional consult

## 2017-01-21 NOTE — Telephone Encounter (Signed)
Also I reviewed over her depression and anxiety questionnaire both of these are considered negative. I would recommend a standard follow-up of visit in 8 weeks to see how her weight is doing please see other phone message

## 2017-01-28 ENCOUNTER — Encounter: Payer: 59 | Admitting: Nurse Practitioner

## 2017-04-16 ENCOUNTER — Encounter: Payer: Self-pay | Admitting: Nurse Practitioner

## 2017-04-19 ENCOUNTER — Encounter: Payer: Self-pay | Admitting: Family Medicine

## 2017-05-11 DIAGNOSIS — Z0100 Encounter for examination of eyes and vision without abnormal findings: Secondary | ICD-10-CM | POA: Diagnosis not present

## 2017-06-09 ENCOUNTER — Other Ambulatory Visit: Payer: Self-pay | Admitting: Nurse Practitioner

## 2017-07-08 ENCOUNTER — Encounter: Payer: Self-pay | Admitting: Adult Health

## 2017-08-19 ENCOUNTER — Encounter: Payer: Self-pay | Admitting: Adult Health

## 2017-10-29 ENCOUNTER — Encounter: Payer: Self-pay | Admitting: Adult Health

## 2017-11-01 ENCOUNTER — Ambulatory Visit: Payer: BLUE CROSS/BLUE SHIELD | Admitting: Adult Health

## 2017-11-01 ENCOUNTER — Encounter: Payer: Self-pay | Admitting: Adult Health

## 2017-11-01 ENCOUNTER — Ambulatory Visit: Payer: Self-pay

## 2017-11-01 VITALS — BP 122/76 | HR 83 | Ht 62.0 in | Wt 128.0 lb

## 2017-11-01 DIAGNOSIS — N9089 Other specified noninflammatory disorders of vulva and perineum: Secondary | ICD-10-CM

## 2017-11-01 DIAGNOSIS — Z113 Encounter for screening for infections with a predominantly sexual mode of transmission: Secondary | ICD-10-CM | POA: Diagnosis not present

## 2017-11-01 DIAGNOSIS — R300 Dysuria: Secondary | ICD-10-CM

## 2017-11-01 LAB — POCT URINALYSIS DIPSTICK
Blood, UA: NEGATIVE
Glucose, UA: NEGATIVE
Leukocytes, UA: NEGATIVE
Nitrite, UA: NEGATIVE

## 2017-11-01 MED ORDER — NYSTATIN-TRIAMCINOLONE 100000-0.1 UNIT/GM-% EX CREA: 1.0000 "application " | TOPICAL_CREAM | Freq: Two times a day (BID) | CUTANEOUS | 0 refills | Status: DC

## 2017-11-01 NOTE — Progress Notes (Signed)
Subjective:     Patient ID: Brianna Banks, female   DOB: Aug 16, 1996, 22 y.o.   MRN: [id number]  HPI Brianna Banks is a 22 year old white female in complaining of burning when pees and irritation.[place] and was in a hot tub a lot.  Review of Systems Burning when pees +irritation Reviewed past medical,surgical, social and family history. Reviewed medications and allergies.     Objective:   Physical Exam BP 122/76 (BP Location: Left Arm, Patient Position: Sitting, Cuff Size: Normal)   Pulse 83   Ht 5\' 2"  (1.575 m)   Wt 128 lb (58.1 kg)   BMI 23.41 kg/m    Urine dipstick trace protein. Skin warm and dry.Pelvic: external genitalia is normal in appearance, has small cut near clitoris and on left labia, HSV culture obtained, vagina: white discharge without odor,urethra has no lesions or masses noted, cervix:smooth and, uterus: normal size, shape and contour, non tender, no masses felt, adnexa: no masses or tenderness noted. Bladder is non tender and no masses felt. Nuswab obtained. She says she was told she had HSV 1 in college.  Assessment:     1. Labial irritation   2. Burning with urination   3. Screening examination for STD (sexually transmitted disease)       Plan:    Can use mytrex on outside tissues to sooth  Meds ordered this encounter  Medications  . nystatin-triamcinolone (MYCOLOG II) cream    Sig: Apply 1 application topically 2 (two) times daily.    Dispense:  30 g    Refill:  0    Order Specific Question:   Supervising Provider    Answer:   Duane LopeEURE, LUTHER H [[id number]]  HSV culture sent Nuswab sent No sex for now F/U prn

## 2017-11-04 ENCOUNTER — Telehealth: Payer: Self-pay | Admitting: Adult Health

## 2017-11-04 LAB — HERPES SIMPLEX VIRUS CULTURE

## 2017-11-04 LAB — NUSWAB VAGINITIS PLUS (VG+)
Atopobium vaginae: HIGH Score — AB
Candida albicans, NAA: POSITIVE — AB
Candida glabrata, NAA: NEGATIVE
Chlamydia trachomatis, NAA: NEGATIVE
Neisseria gonorrhoeae, NAA: NEGATIVE
Trich vag by NAA: NEGATIVE

## 2017-11-04 MED ORDER — FLUCONAZOLE 150 MG PO TABS: ORAL_TABLET | ORAL | 1 refills | Status: DC

## 2017-11-04 MED ORDER — METRONIDAZOLE 500 MG PO TABS: 500.0000 mg | ORAL_TABLET | Freq: Two times a day (BID) | ORAL | 0 refills | Status: DC

## 2017-11-04 NOTE — Telephone Encounter (Signed)
Pt aware nuswab +BV and yeast,negative for trich, GC/CHL, and that HSV culture negative.Will rx flagyl and diflucan,can continue to use mytrex for outside

## 2018-02-03 ENCOUNTER — Other Ambulatory Visit: Payer: Self-pay | Admitting: Nurse Practitioner

## 2018-02-14 ENCOUNTER — Encounter: Payer: Self-pay | Admitting: Adult Health

## 2018-02-15 ENCOUNTER — Encounter: Payer: Self-pay | Admitting: Adult Health

## 2018-02-16 ENCOUNTER — Ambulatory Visit: Payer: BLUE CROSS/BLUE SHIELD | Admitting: Adult Health

## 2018-02-16 ENCOUNTER — Encounter: Payer: Self-pay | Admitting: Adult Health

## 2018-02-16 ENCOUNTER — Ambulatory Visit: Payer: BLUE CROSS/BLUE SHIELD | Admitting: Nurse Practitioner

## 2018-02-16 VITALS — BP 120/85 | HR 114 | Ht 62.0 in | Wt 125.0 lb

## 2018-02-16 DIAGNOSIS — B009 Herpesviral infection, unspecified: Secondary | ICD-10-CM | POA: Diagnosis not present

## 2018-02-16 MED ORDER — VALACYCLOVIR HCL 1 G PO TABS: 1000.0000 mg | ORAL_TABLET | Freq: Two times a day (BID) | ORAL | 2 refills | Status: DC

## 2018-02-16 NOTE — Progress Notes (Signed)
  Subjective:     Patient ID: Brianna ManesBrianna H Banks, female   DOB: 18-Aug-1996, 22 y.o.   MRN: [id number]  HPI Colin MuldersBrianna is a 22 year old white female in complaining of raw painful area at introitus for last few days, has had headache and had fever last week.[facility]. 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   Review of Systems +raw,painful area at introitus,for few days, has had +HSV1 culture in past +headache,goody's and tylenol did not help  Had fever last week  Reviewed past medical,surgical, social and family history. Reviewed medications and allergies.     Objective:   Physical Exam BP 120/85 (BP Location: Left Arm, Patient Position: Sitting, Cuff Size: Normal)   Pulse (!) 114   Ht 5\' 2"  (1.575 m)   Wt 125 lb (56.7 kg)   LMP 01/30/2018   BMI 22.86 kg/m  Skin warm and dry.Pelvic: external genitalia is normal in appearance, vagina: has linear cracks at introitus is tender. Will treat with valtrex, and discussed herpes and shedding with her.     Assessment:     1. Herpes infection       Plan:    Use condoms Meds ordered this encounter  Medications  . valACYclovir (VALTREX) 1000 MG tablet    Sig: Take 1 tablet (1,000 mg total) by mouth 2 (two) times daily.    Dispense:  20 tablet    Refill:  2    Order Specific Question:   Supervising Provider    Answer:   Duane LopeEURE, LUTHER H [[id number]]  Can try Excedrin migraine for headache  Review handout on herpes by Krames  Return in about 4 months for pap and physical

## 2018-03-06 ENCOUNTER — Encounter: Payer: Self-pay | Admitting: Family Medicine

## 2018-03-16 ENCOUNTER — Encounter: Payer: Self-pay | Admitting: Adult Health

## 2018-04-24 HISTORY — PX: WISDOM TOOTH EXTRACTION: SHX21

## 2018-05-19 ENCOUNTER — Encounter: Payer: Self-pay | Admitting: Family Medicine

## 2018-05-19 NOTE — Telephone Encounter (Signed)
The patient may have a work excuse for today

## 2018-05-19 NOTE — Telephone Encounter (Signed)
Work note is up front for pickup 

## 2018-06-20 ENCOUNTER — Other Ambulatory Visit: Payer: BLUE CROSS/BLUE SHIELD | Admitting: Adult Health

## 2018-06-28 ENCOUNTER — Ambulatory Visit (INDEPENDENT_AMBULATORY_CARE_PROVIDER_SITE_OTHER): Payer: BLUE CROSS/BLUE SHIELD | Admitting: Family Medicine

## 2018-06-28 ENCOUNTER — Encounter: Payer: Self-pay | Admitting: Family Medicine

## 2018-06-28 VITALS — BP 116/78 | Temp 98.7°F | Ht 62.0 in | Wt 119.4 lb

## 2018-06-28 DIAGNOSIS — F411 Generalized anxiety disorder: Secondary | ICD-10-CM | POA: Diagnosis not present

## 2018-06-28 MED ORDER — SERTRALINE HCL 50 MG PO TABS: 50.0000 mg | ORAL_TABLET | Freq: Every day | ORAL | 1 refills | Status: DC

## 2018-06-28 NOTE — Progress Notes (Signed)
   Subjective:    Patient ID: Brianna Banks, female    DOB: 10-May-1996, 22 y.o.   MRN: [id number]  Anxiety  Presents for initial visit. Episode onset: was on anxiety meds about 2 years ago. Symptoms include excessive worry. Patient reports no chest pain or shortness of breath. The quality of sleep is fair (wakes up all through the night and is tired throughout the day). Nighttime awakenings: several.   Past treatments include counseling (CBT).   Pt also states she has been congested. 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  20 minutes spent with patient discussing anxiety depression not suicidal in addition to this discussing how she is very busy currently but hopefully with time her schedule will settle down  Review of Systems  Constitutional: Negative for activity change, appetite change, fatigue and fever.  HENT: Positive for rhinorrhea. Negative for congestion and ear pain.   Eyes: Negative for discharge.  Respiratory: Negative for cough, shortness of breath and wheezing.   Cardiovascular: Negative for chest pain.  Gastrointestinal: Negative for abdominal pain.  Skin: Negative for color change.  Neurological: Negative for headaches.  Psychiatric/Behavioral: Negative for behavioral problems.       Objective:   Physical Exam  Constitutional: She appears well-developed.  HENT:  Head: Normocephalic.  Nose: Nose normal.  Mouth/Throat: Oropharynx is clear and moist. No oropharyngeal exudate.  Neck: Neck supple.  Cardiovascular: Normal rate and normal heart sounds.  No murmur heard. Pulmonary/Chest: Effort normal and breath sounds normal. She has no wheezes.  Lymphadenopathy:    She has no cervical adenopathy.  Skin: Skin is warm and dry.  Nursing note and vitals reviewed.         Assessment & Plan:  Generalized anxiety disorder PHQ GAD 7 reviewed Patient not suicidal Zoloft low dose Patient to send [place] update in 2 weeks follow-up  in 4 to 6 weeks If she starts feeling worse on the medicine to stop the medicine and notify us

## 2018-07-22 ENCOUNTER — Encounter: Payer: Self-pay | Admitting: Family Medicine

## 2018-07-22 ENCOUNTER — Other Ambulatory Visit: Payer: Self-pay | Admitting: *Deleted

## 2018-07-22 MED ORDER — SERTRALINE HCL 100 MG PO TABS: 100.0000 mg | ORAL_TABLET | Freq: Every day | ORAL | 0 refills | Status: DC

## 2018-08-05 ENCOUNTER — Encounter: Payer: Self-pay | Admitting: Family Medicine

## 2018-08-09 ENCOUNTER — Ambulatory Visit: Payer: BLUE CROSS/BLUE SHIELD | Admitting: Family Medicine

## 2018-08-09 ENCOUNTER — Other Ambulatory Visit: Payer: Self-pay

## 2018-08-09 DIAGNOSIS — Z029 Encounter for administrative examinations, unspecified: Secondary | ICD-10-CM

## 2018-08-09 MED ORDER — BUSPIRONE HCL 10 MG PO TABS: 10.0000 mg | ORAL_TABLET | Freq: Two times a day (BID) | ORAL | 5 refills | Status: DC

## 2018-08-09 NOTE — Telephone Encounter (Signed)
Nurses Please send in BuSpar 10 mg 1 twice daily, #60, 5 refills Stop Zoloft Recommend follow-up within 6 weeks Feel free to do these and send the patient a my chart message

## 2018-08-26 ENCOUNTER — Encounter: Payer: Self-pay | Admitting: Family Medicine

## 2018-08-29 ENCOUNTER — Ambulatory Visit: Payer: BLUE CROSS/BLUE SHIELD | Admitting: Adult Health

## 2018-09-17 ENCOUNTER — Encounter: Payer: Self-pay | Admitting: Family Medicine

## 2018-09-29 ENCOUNTER — Ambulatory Visit (INDEPENDENT_AMBULATORY_CARE_PROVIDER_SITE_OTHER): Payer: BLUE CROSS/BLUE SHIELD | Admitting: Adult Health

## 2018-09-29 ENCOUNTER — Encounter: Payer: Self-pay | Admitting: Adult Health

## 2018-09-29 ENCOUNTER — Other Ambulatory Visit (HOSPITAL_COMMUNITY)
Admission: RE | Admit: 2018-09-29 | Discharge: 2018-09-29 | Disposition: A | Discharge status: Home / Self Care | Payer: BLUE CROSS/BLUE SHIELD | Source: Ambulatory Visit | Attending: Adult Health | Admitting: Adult Health

## 2018-09-29 VITALS — BP 114/73 | HR 95 | Ht 62.25 in | Wt 123.0 lb

## 2018-09-29 DIAGNOSIS — Z01419 Encounter for gynecological examination (general) (routine) without abnormal findings: Secondary | ICD-10-CM | POA: Insufficient documentation

## 2018-09-29 DIAGNOSIS — F329 Major depressive disorder, single episode, unspecified: Secondary | ICD-10-CM

## 2018-09-29 DIAGNOSIS — F32A Depression, unspecified: Secondary | ICD-10-CM | POA: Insufficient documentation

## 2018-09-29 MED ORDER — BUPROPION HCL 100 MG PO TABS: 100.0000 mg | ORAL_TABLET | Freq: Two times a day (BID) | ORAL | 2 refills | Status: DC

## 2018-09-29 MED ORDER — BUTALBITAL-APAP-CAFFEINE 50-325-40 MG PO TABS: 1.0000 | ORAL_TABLET | Freq: Four times a day (QID) | ORAL | 0 refills | Status: DC | PRN

## 2018-09-29 NOTE — Progress Notes (Addendum)
Patient ID: Brianna Banks, female   DOB: Nov 23, 1995, 23 y.o.   MRN: [id number] History of Present Illness: Brianna Banks is a 23 year old white female in for well woman gyn exam and first pap. PCP is [facility].    Current Medications, Allergies, Past Medical History, Past Surgical History, Family History and Social History were reviewed in [facility] electronic medical record.     Review of Systems:  Patient denies any hearing loss, fatigue, blurred vision, shortness of breath, chest pain, abdominal pain, problems with bowel movements, urination, or intercourse. No joint pain or mood swings. +daily headaches +depression  Physical Exam:BP 114/73 (BP Location: Left Arm, Patient Position: Sitting, Cuff Size: Normal)   Pulse 95   Ht 5' 2.25" (1.581 m)   Wt 123 lb (55.8 kg)   LMP 09/11/2018   BMI 22.32 kg/m  General:  Well developed, well nourished, no acute distress Skin:  Warm and dry Neck:  Midline trachea, normal thyroid, good ROM, no lymphadenopathy Lungs; Clear to auscultation bilaterally Breast:  No dominant palpable mass, retraction, or nipple discharge Cardiovascular: Regular rate and rhythm Abdomen:  Soft, non tender, no hepatosplenomegaly Pelvic:  External genitalia is normal in appearance, no lesions.  The vagina is normal in appearance,nuva ring in place. Marland Kitchen. Urethra has no lesions or masses. The cervix is nulliparous, pap with CG/CHL performed.Marland Kitchen.  Uterus is felt to be normal size, shape, and contour.  No adnexal masses or tenderness noted.Bladder is non tender, no masses felt. Extremities/musculoskeletal:  No swelling or varicosities noted, no clubbing or cyanosis Psych:  No mood changes, alert and cooperative,seems happy Fall risk is low. PHQ 9 score 18, denies being suicidal and is on buspar, will add wellbutrin Examination chaperoned by Malachy MoodJanet Young LPN.  Impression: 1. Encounter for gynecological examination with Papanicolaou smear of cervix   2. Depression,  unspecified depression type       Plan: Meds ordered this encounter  Medications  . buPROPion (WELLBUTRIN) 100 MG tablet    Sig: Take 1 tablet (100 mg total) by mouth 2 (two) times daily.    Dispense:  60 tablet    Refill:  2    Order Specific Question:   Supervising Provider    Answer:   Despina HiddenEURE, LUTHER H [[id number]]  . butalbital-acetaminophen-caffeine (FIORICET, ESGIC) 50-325-40 MG tablet    Sig: Take 1-2 tablets by mouth every 6 (six) hours as needed for headache.    Dispense:  20 tablet    Refill:  0    Order Specific Question:   Supervising Provider    Answer:   Despina HiddenEURE, LUTHER H [[id number]]   F/U in 6 weeks Physical in 1 year Pap in 3 if normal

## 2018-10-01 LAB — CYTOLOGY - PAP
Chlamydia: NEGATIVE
Diagnosis: NEGATIVE
Neisseria Gonorrhea: NEGATIVE

## 2018-11-10 ENCOUNTER — Ambulatory Visit: Payer: BLUE CROSS/BLUE SHIELD | Admitting: Adult Health

## 2018-11-10 ENCOUNTER — Encounter: Payer: Self-pay | Admitting: Adult Health

## 2018-11-10 ENCOUNTER — Other Ambulatory Visit: Payer: Self-pay

## 2018-11-10 VITALS — BP 111/76 | HR 93 | Ht 62.25 in | Wt 118.0 lb

## 2018-11-10 DIAGNOSIS — F329 Major depressive disorder, single episode, unspecified: Secondary | ICD-10-CM | POA: Diagnosis not present

## 2018-11-10 DIAGNOSIS — F32A Depression, unspecified: Secondary | ICD-10-CM

## 2018-11-10 MED ORDER — FLUOXETINE HCL 20 MG PO TABS: 20.0000 mg | ORAL_TABLET | Freq: Every day | ORAL | 3 refills | Status: DC

## 2018-11-10 NOTE — Progress Notes (Signed)
Patient ID: Brianna Banks, female   DOB: [birth year], 23 y.o.   MRN: [id number] History of Present Illness: Brianna Banks is a 23 year old white female, back in follow up on starting Wellbutrin and is still depressed and not sleeping well, feels tired. She says her twin is pregnant.  PCP is [facility].    Current Medications, Allergies, Past Medical History, Past Surgical History, Family History and Social History were reviewed in [employer] [facility] record.     Review of Systems: +depressed, not sleeping well  +tired.    Physical Exam:BP 111/76 (BP Location: Left Arm, Patient Position: Sitting, Cuff Size: Normal)   Pulse 93   Ht 5' 2.25" (1.581 m)   Wt 118 lb (53.5 kg)   LMP 11/08/2018 (Exact Date)   BMI 21.41 kg/m  General:  Well developed, well nourished, no acute distress Skin:  Warm and dry Lungs; Clear to auscultation bilaterally Cardiovascular: Regular rate and rhythm Psych:  No mood changes, alert and cooperative,seems happy PHQ 9 score is 13, which is better was 18 in February, she denies being suicidal, but wants to try something else.will stop Wellbutrin and try Prozac, and continue Buspar. .   Impression:  1. Depression, unspecified depression type      Plan: Stop Wellbutrin and start Prozac Meds ordered this encounter  Medications  . FLUoxetine (PROZAC) 20 MG tablet    Sig: Take 1 tablet (20 mg total) by mouth daily.    Dispense:  30 tablet    Refill:  3    Order Specific Question:   Supervising Provider    Answer:   Duane Lope H [[id number]]  Continue Buspar Follow up in 6 weeks

## 2018-12-06 ENCOUNTER — Encounter (HOSPITAL_COMMUNITY): Payer: Self-pay | Admitting: Emergency Medicine

## 2018-12-06 ENCOUNTER — Emergency Department (HOSPITAL_COMMUNITY): Payer: BLUE CROSS/BLUE SHIELD

## 2018-12-06 ENCOUNTER — Emergency Department (HOSPITAL_COMMUNITY)
Admission: EM | Admit: 2018-12-06 | Discharge: 2018-12-06 | Disposition: A | Discharge status: Home / Self Care | Payer: BLUE CROSS/BLUE SHIELD | Attending: Emergency Medicine | Admitting: Emergency Medicine

## 2018-12-06 ENCOUNTER — Other Ambulatory Visit: Payer: Self-pay

## 2018-12-06 DIAGNOSIS — Z79899 Other long term (current) drug therapy: Secondary | ICD-10-CM | POA: Insufficient documentation

## 2018-12-06 DIAGNOSIS — R1033 Periumbilical pain: Secondary | ICD-10-CM | POA: Diagnosis not present

## 2018-12-06 DIAGNOSIS — R109 Unspecified abdominal pain: Secondary | ICD-10-CM | POA: Diagnosis present

## 2018-12-06 LAB — URINALYSIS, ROUTINE W REFLEX MICROSCOPIC
Bacteria, UA: NONE SEEN
Bilirubin Urine: NEGATIVE
Glucose, UA: NEGATIVE mg/dL
Ketones, ur: NEGATIVE mg/dL
Leukocytes,Ua: NEGATIVE
Nitrite: NEGATIVE
Protein, ur: NEGATIVE mg/dL
Specific Gravity, Urine: 1.01 (ref 1.005–1.030)
pH: 6 (ref 5.0–8.0)

## 2018-12-06 LAB — CBC
HCT: 44.7 % (ref 36.0–46.0)
Hemoglobin: 14 g/dL (ref 12.0–15.0)
MCH: 28.2 pg (ref 26.0–34.0)
MCHC: 31.3 g/dL (ref 30.0–36.0)
MCV: 90.1 fL (ref 80.0–100.0)
Platelets: 173 10*3/uL (ref 150–400)
RBC: 4.96 MIL/uL (ref 3.87–5.11)
RDW: 13.6 % (ref 11.5–15.5)
WBC: 6.2 10*3/uL (ref 4.0–10.5)
nRBC: 0 % (ref 0.0–0.2)

## 2018-12-06 LAB — LIPASE, BLOOD: Lipase: 31 U/L (ref 11–51)

## 2018-12-06 LAB — COMPREHENSIVE METABOLIC PANEL
ALT: 16 U/L (ref 0–44)
AST: 20 U/L (ref 15–41)
Albumin: 3.9 g/dL (ref 3.5–5.0)
Alkaline Phosphatase: 47 U/L (ref 38–126)
Anion gap: 7 (ref 5–15)
BUN: 6 mg/dL (ref 6–20)
CO2: 24 mmol/L (ref 22–32)
Calcium: 8.9 mg/dL (ref 8.9–10.3)
Chloride: 106 mmol/L (ref 98–111)
Creatinine, Ser: 0.76 mg/dL (ref 0.44–1.00)
GFR calc Af Amer: >60 mL/min (ref >60)
GFR calc non Af Amer: >60 mL/min (ref >60)
Glucose, Bld: 83 mg/dL (ref 70–99)
Potassium: 4 mmol/L (ref 3.5–5.1)
Sodium: 137 mmol/L (ref 135–145)
Total Bilirubin: 0.5 mg/dL (ref 0.3–1.2)
Total Protein: 7.5 g/dL (ref 6.5–8.1)

## 2018-12-06 LAB — POC URINE PREG, ED: Preg Test, Ur: NEGATIVE

## 2018-12-06 MED ORDER — IOHEXOL 300 MG/ML  SOLN
75.0000 mL | Freq: Once | INTRAMUSCULAR | Status: AC | PRN
  Administered 2018-12-06: 75 mL via INTRAVENOUS

## 2018-12-06 MED ORDER — SODIUM CHLORIDE 0.9 % IV BOLUS
1000.0000 mL | Freq: Once | INTRAVENOUS | Status: AC
  Administered 2018-12-06: 1000 mL via INTRAVENOUS

## 2018-12-06 MED ORDER — AMOXICILLIN-POT CLAVULANATE 875-125 MG PO TABS
1.0000 | ORAL_TABLET | Freq: Once | ORAL | Status: AC
  Administered 2018-12-06: 1 via ORAL
  Filled 2018-12-06: qty 1

## 2018-12-06 MED ORDER — DICYCLOMINE HCL 20 MG PO TABS: 20.0000 mg | ORAL_TABLET | Freq: Two times a day (BID) | ORAL | 0 refills | Status: DC | PRN

## 2018-12-06 MED ORDER — MORPHINE SULFATE (PF) 2 MG/ML IV SOLN
2.0000 mg | Freq: Once | INTRAVENOUS | Status: AC
  Administered 2018-12-06: 2 mg via INTRAVENOUS
  Filled 2018-12-06: qty 1

## 2018-12-06 MED ORDER — AMOXICILLIN-POT CLAVULANATE 875-125 MG PO TABS: 1.0000 | ORAL_TABLET | Freq: Two times a day (BID) | ORAL | 0 refills | Status: DC

## 2018-12-06 NOTE — ED Triage Notes (Signed)
Pt states waking up with sharp lower abdominal pain since 0540. Denies urinary symptoms or n/v/d

## 2018-12-06 NOTE — Discharge Instructions (Addendum)
You may take Tylenol as needed for pain in addition to the Bentyl prescribed.  Take your first dose of antibiotic this evening.  If your symptoms worsen, the pain moves to your right lower abdomen, you develop fever, persistent vomiting, you need to return to the emergency department immediately.

## 2018-12-06 NOTE — ED Provider Notes (Signed)
[facility] EMERGENCY DEPARTMENT Provider Note   CSN: [id number] Arrival date & time: 12/06/18  0848    History   Chief Complaint Chief Complaint  Patient presents with   Abdominal Pain    HPI Brianna Banks is a 23 y.o. female.     HPI Patient presents with periumbilical pain which woke her up this morning.  States the pain is worse with movement and deep breathing.  No radiation of the pain.  No nausea or vomiting.  Normal bowel movement yesterday.  No diarrhea.  No fever or chills.  States she just came off of her period.  She is had no vaginal bleeding or discharge.  No previous abdominal surgeries. Past Medical History:  Diagnosis Date   Abnormal uterine bleeding (AUB) 03/11/2015   Atopic dermatitis    Depression    Herpes simplex virus (HSV) infection    HSV1    Patient Active Problem List   Diagnosis Date Noted   Depression 09/29/2018   Encounter for gynecological examination with Papanicolaou smear of cervix 09/29/2018   Anxiety 05/15/2015   OCD (obsessive compulsive disorder) 05/15/2015   Abnormal uterine bleeding (AUB) 03/11/2015    Past Surgical History:  Procedure Laterality Date   TONSILECTOMY/ADENOIDECTOMY WITH MYRINGOTOMY  2013   WISDOM TOOTH EXTRACTION  04/2018     OB History    Gravida  0   Para  0   Term  0   Preterm  0   AB  0   Living  0     SAB  0   TAB  0   Ectopic  0   Multiple  0   Live Births               Home Medications    Prior to Admission medications   Medication Sig Start Date End Date Taking? Authorizing Provider  amoxicillin-clavulanate (AUGMENTIN) 875-125 MG tablet Take 1 tablet by mouth 2 (two) times daily. One po bid x 7 days 12/06/18   Loren Racer, MD  busPIRone (BUSPAR) 10 MG tablet Take 1 tablet (10 mg total) by mouth 2 (two) times daily. 08/09/18   Babs Sciara, MD  butalbital-acetaminophen-caffeine (FIORICET, ESGIC) [phone] MG tablet Take 1-2 tablets by mouth every 6 (six)  hours as needed for headache. 09/29/18 09/29/19  Adline Potter, NP  dicyclomine (BENTYL) 20 MG tablet Take 1 tablet (20 mg total) by mouth 2 (two) times daily as needed for spasms. 12/06/18   Loren Racer, MD  FLUoxetine (PROZAC) 20 MG tablet Take 1 tablet (20 mg total) by mouth daily. 11/10/18   Adline Potter, NP  NUVARING 0.12-0.015 MG/24HR vaginal ring INSERT 1 RING VAGINALLY. REMOVE AND DISCARD AFTER 3 WEEKS. INSERT NEWRING ONE WEEK LATER. 02/04/18   Campbell Riches, NP    Family History Family History  Problem Relation Age of Onset   Arthritis Mother    Other Mother        nerve damage in back   Hypertension Father    Other Father        back pain; nerve damage   Cancer Maternal Aunt    Other Maternal Aunt        horse shoe kidney   Bipolar disorder Maternal Aunt    Depression Maternal Aunt    Cancer Maternal Uncle    Diabetes Paternal Aunt    COPD Paternal Aunt    Arthritis Maternal Grandmother    Hypertension Maternal Grandmother    COPD Maternal  Grandmother    Diabetes Maternal Grandmother    Depression Maternal Grandmother    Stroke Maternal Grandmother    Other Maternal Grandmother        restless leg; neuropathy; adrenal gland stopped working; B12 def.   Cancer Maternal Grandfather    Cancer Paternal Grandmother    GER disease Paternal Grandmother    Hypertension Paternal Grandmother    Diabetes Paternal Grandfather    Heart attack Paternal Grandfather    Other Paternal Grandfather        brain aneursym   Depression Maternal Aunt    Diabetes Maternal Aunt        Prediabetes   Other Maternal Aunt        restless leg   Bipolar disorder Maternal Aunt     Social History Social History   Tobacco Use   Smoking status: Never Smoker   Smokeless tobacco: Never Used  Substance Use Topics   Alcohol use: No   Drug use: No     Allergies   Aspirin and Cefprozil   Review of Systems Review of Systems    Constitutional: Negative for chills and fever.  Gastrointestinal: Positive for abdominal pain. Negative for constipation, diarrhea, nausea and vomiting.  Genitourinary: Negative for dysuria, flank pain, frequency, hematuria, pelvic pain, vaginal bleeding and vaginal discharge.  Musculoskeletal: Negative for back pain.  Skin: Negative for rash and wound.  Neurological: Negative for dizziness, weakness, light-headedness, numbness and headaches.  All other systems reviewed and are negative.    Physical Exam Updated Vital Signs BP 99/62 (BP Location: Right Arm)    Pulse 65    Temp 98.3 F (36.8 C) (Oral)    Resp 16    Ht 5\' 2"  (1.575 m)    Wt 53.1 kg    LMP 11/08/2018 (Exact Date)    SpO2 100%    BMI 21.40 kg/m   Physical Exam Vitals signs and nursing note reviewed.  Constitutional:      Appearance: Normal appearance. She is well-developed.  HENT:     Head: Normocephalic and atraumatic.  Eyes:     Pupils: Pupils are equal, round, and reactive to light.  Neck:     Musculoskeletal: Normal range of motion and neck supple.  Cardiovascular:     Rate and Rhythm: Normal rate and regular rhythm.     Heart sounds: No murmur. No friction rub. No gallop.   Pulmonary:     Effort: Pulmonary effort is normal. No respiratory distress.     Breath sounds: Normal breath sounds. No stridor. No wheezing, rhonchi or rales.  Chest:     Chest wall: No tenderness.  Abdominal:     General: Bowel sounds are normal.     Palpations: Abdomen is soft.     Tenderness: There is abdominal tenderness. There is no right CVA tenderness, left CVA tenderness, guarding or rebound.     Comments: Diffuse abdominal tenderness to palpation appears most pronounced in the periumbilical region.  No definite rebound or guarding.  Musculoskeletal: Normal range of motion.        General: No swelling, tenderness, deformity or signs of injury.     Right lower leg: No edema.     Left lower leg: No edema.  Skin:    General:  Skin is warm and dry.     Capillary Refill: Capillary refill takes less than 2 seconds.     Findings: No erythema or rash.  Neurological:     General: No focal deficit present.  Mental Status: She is alert and oriented to person, place, and time.  Psychiatric:        Mood and Affect: Mood normal.        Behavior: Behavior normal.      ED Treatments / Results  Labs (all labs ordered are listed, but only abnormal results are displayed) Labs Reviewed  URINALYSIS, ROUTINE W REFLEX MICROSCOPIC - Abnormal; Notable for the following components:      Result Value   Hgb urine dipstick MODERATE (*)    All other components within normal limits  LIPASE, BLOOD  COMPREHENSIVE METABOLIC PANEL  CBC  POC URINE PREG, ED    EKG None  Radiology Ct Abdomen Pelvis W Contrast  Result Date: 12/06/2018 CLINICAL DATA:  Generalized abdominal pain EXAM: CT ABDOMEN AND PELVIS WITH CONTRAST TECHNIQUE: Multidetector CT imaging of the abdomen and pelvis was performed using the standard protocol following bolus administration of intravenous contrast. CONTRAST:  75mL OMNIPAQUE IOHEXOL 300 MG/ML  SOLN COMPARISON:  None. FINDINGS: Lower chest: Lung bases are clear. Hepatobiliary: No focal liver lesions are appreciable. The gallbladder wall is not appreciably thickened. There is no biliary duct dilatation. Pancreas: No pancreatic mass or inflammatory focus. Spleen: No splenic lesions are evident. Adrenals/Urinary Tract: Adrenals bilaterally appear normal. Kidneys bilaterally show no evident mass or hydronephrosis on either side. There is no appreciable renal or ureteral calculus on either side. Urinary bladder is midline with wall thickness within normal limits. Stomach/Bowel: There is moderate stool in the colon. There is no appreciable bowel wall or mesenteric thickening. There is no evident bowel obstruction. There is no free air or portal venous air. Vascular/Lymphatic: No abdominal aortic aneurysm. No vascular  lesions are evident. There is a retroaortic left renal vein, an anatomic variant. There is no adenopathy in the abdomen or pelvis. Reproductive: The uterus is anteverted and somewhat canted toward the left. No pelvic masses evident. There is a small amount of free fluid in the cul-de-sac region. Other: Appendix is prominent with a measured transverse diameter of 9 mm. There is no appreciable fluid or soft tissue stranding in the region of the appendix. No free air or abscess in this area. Elsewhere, there is no evident abscess in the abdomen or pelvis. There is no ascites beyond mild fluid in the cul-de-sac. Musculoskeletal: There are no blastic or lytic bone lesions. No intramuscular or abdominal wall lesion evident. IMPRESSION: 1. Prominent appendix, without associated soft tissue stranding or thickening. Appearance is concerning for earliest changes of acute appendicitis, but must be considered somewhat equivocal. Close clinical and laboratory surveillance advised. Surgical consultation is felt to be warranted given this circumstance. Appendix: Location: Appendix arises medially and inferiorly to the cecum with extension into the mid pelvis. Diameter: 9 mm Appendicolith: None Mucosal hyper-enhancement: None Extraluminal gas: None Periappendiceal collection: None. No appreciable periappendiceal soft tissue stranding. 2. Mild fluid in the cul-de-sac. Question recent ovarian cyst rupture. 3.  No bowel obstruction.  No abscess in the abdomen or pelvis. 4.  No evident renal or ureteral calculus.  No hydronephrosis. Critical Value/emergent results were called by telephone at the time of interpretation on 12/06/2018 at 10:31 am to Dr. Loren Racer , who verbally acknowledged these results. Electronically Signed   By: Bretta Bang III M.D.   On: 12/06/2018 10:32    Procedures Procedures (including critical care time)  Medications Ordered in ED Medications  morphine 2 MG/ML injection 2 mg (2 mg Intravenous  Given 12/06/18 0924)  sodium chloride 0.9 %  bolus 1,000 mL (0 mLs Intravenous Stopped 12/06/18 1100)  iohexol (OMNIPAQUE) 300 MG/ML solution 75 mL (75 mLs Intravenous Contrast Given 12/06/18 1002)  amoxicillin-clavulanate (AUGMENTIN) 875-125 MG per tablet 1 tablet (1 tablet Oral Given 12/06/18 1102)     Initial Impression / Assessment and Plan / ED Course  I have reviewed the triage vital signs and the nursing notes.  Pertinent labs & imaging results that were available during my care of the patient were reviewed by me and considered in my medical decision making (see chart for details).        Mildly enlarged appendix on CT scan.  Discussed with Dr. Lovell Sheehan who reviewed patient's CT.  Patient's abdomen remains soft without rebound or guarding.  She is comfortable appearing.  Taking into account ongoing pandemic and nonfocal exam with normal white blood cell count, Dr. Lovell Sheehan thinks this is best managed with antibiotics and strict return precautions.  I discussed this at length with the patient and she is in agreement with the plan.  She appears reasonable and states she understands that she needs to return immediately to the emergency department should her pain worsen, localized to her right lower quadrant, she developed fever, vomiting or for any other concerns.  Given first dose of Augmentin in the emergency department.  Final Clinical Impressions(s) / ED Diagnoses   Final diagnoses:  Periumbilical abdominal pain    ED Discharge Orders         Ordered    amoxicillin-clavulanate (AUGMENTIN) 875-125 MG tablet  2 times daily     12/06/18 1120    dicyclomine (BENTYL) 20 MG tablet  2 times daily PRN     12/06/18 1120           Loren Racer, MD 12/06/18 1125

## 2018-12-20 ENCOUNTER — Telehealth: Payer: Self-pay | Admitting: *Deleted

## 2018-12-20 NOTE — Telephone Encounter (Signed)
mychart message to patient with appointment information.

## 2018-12-20 NOTE — Telephone Encounter (Signed)
Opened in error

## 2018-12-21 ENCOUNTER — Encounter: Payer: Self-pay | Admitting: Adult Health

## 2018-12-21 ENCOUNTER — Other Ambulatory Visit: Payer: Self-pay

## 2018-12-21 ENCOUNTER — Ambulatory Visit (INDEPENDENT_AMBULATORY_CARE_PROVIDER_SITE_OTHER): Payer: BLUE CROSS/BLUE SHIELD | Admitting: Adult Health

## 2018-12-21 VITALS — Ht 62.0 in

## 2018-12-21 DIAGNOSIS — F32A Depression, unspecified: Secondary | ICD-10-CM

## 2018-12-21 DIAGNOSIS — F329 Major depressive disorder, single episode, unspecified: Secondary | ICD-10-CM | POA: Diagnosis not present

## 2018-12-21 MED ORDER — FLUOXETINE HCL 40 MG PO CAPS: 40.0000 mg | ORAL_CAPSULE | Freq: Every day | ORAL | 3 refills | Status: DC

## 2018-12-21 MED ORDER — BUSPIRONE HCL 10 MG PO TABS: 10.0000 mg | ORAL_TABLET | Freq: Two times a day (BID) | ORAL | 5 refills | Status: DC

## 2018-12-21 NOTE — Progress Notes (Signed)
Patient ID: Brianna Banks, female   DOB: 21-Oct-1995, 23 y.o.   MRN: [id number]   TELEHEALTH VIRTUAL GYNECOLOGY VISIT ENCOUNTER NOTE  I connected with Brianna Banks on 12/21/18 at 10:00 AM EDT by telephone at home and verified that I am speaking with the correct person using two identifiers.   I discussed the limitations, risks, security and privacy concerns of performing an evaluation and management service by telephone and the availability of in person appointments. I also discussed with the patient that there may be a patient responsible charge related to this service. The patient expressed understanding and agreed to proceed.   History:  Brianna Banks is a 23 y.o. G0P0000 female being evaluated today for starting Prozac she says she is better, but may need to be increased. She denies any suicidal ideations other concerns.   PCP is [employer].      Past Medical History:  Diagnosis Date   Abnormal uterine bleeding (AUB) 03/11/2015   Atopic dermatitis    Depression    Herpes simplex virus (HSV) infection    HSV1   Past Surgical History:  Procedure Laterality Date   TONSILECTOMY/ADENOIDECTOMY WITH MYRINGOTOMY  2013   WISDOM TOOTH EXTRACTION  04/2018   The following portions of the patient's history were reviewed and updated as appropriate: allergies, current medications, past family history, past medical history, past social history, past surgical history and problem list.   Health Maintenance:  Normal pap 09/29/2018.  Review of Systems:  Pertinent items noted in HPI and remainder of comprehensive ROS otherwise negative.  Physical Exam:   General:  Alert, oriented and cooperative.   Mental Status: Normal mood and affect perceived. Normal judgment and thought content.  Physical exam deferred due to nature of the encounter Ht 5\' 2"  (1.575 m)    LMP 12/04/2018    BMI 21.40 kg/m per pt. Fall risk is low. PHQ 2 score is 1 which is better that on 11/10/18 was 13.She  says she is better but thinks Prozac needs to be increased.  Labs and Imaging No results found for this or any previous visit (from the past 336 hour(s)). Ct Abdomen Pelvis W Contrast  Result Date: 12/06/2018 CLINICAL DATA:  Generalized abdominal pain EXAM: CT ABDOMEN AND PELVIS WITH CONTRAST TECHNIQUE: Multidetector CT imaging of the abdomen and pelvis was performed using the standard protocol following bolus administration of intravenous contrast. CONTRAST:  93mL OMNIPAQUE IOHEXOL 300 MG/ML  SOLN COMPARISON:  None. FINDINGS: Lower chest: Lung bases are clear. Hepatobiliary: No focal liver lesions are appreciable. The gallbladder wall is not appreciably thickened. There is no biliary duct dilatation. Pancreas: No pancreatic mass or inflammatory focus. Spleen: No splenic lesions are evident. Adrenals/Urinary Tract: Adrenals bilaterally appear normal. Kidneys bilaterally show no evident mass or hydronephrosis on either side. There is no appreciable renal or ureteral calculus on either side. Urinary bladder is midline with wall thickness within normal limits. Stomach/Bowel: There is moderate stool in the colon. There is no appreciable bowel wall or mesenteric thickening. There is no evident bowel obstruction. There is no free air or portal venous air. Vascular/Lymphatic: No abdominal aortic aneurysm. No vascular lesions are evident. There is a retroaortic left renal vein, an anatomic variant. There is no adenopathy in the abdomen or pelvis. Reproductive: The uterus is anteverted and somewhat canted toward the left. No pelvic masses evident. There is a small amount of free fluid in the cul-de-sac region. Other: Appendix is prominent with a measured transverse diameter of  9 mm. There is no appreciable fluid or soft tissue stranding in the region of the appendix. No free air or abscess in this area. Elsewhere, there is no evident abscess in the abdomen or pelvis. There is no ascites beyond mild fluid in the  cul-de-sac. Musculoskeletal: There are no blastic or lytic bone lesions. No intramuscular or abdominal wall lesion evident. IMPRESSION: 1. Prominent appendix, without associated soft tissue stranding or thickening. Appearance is concerning for earliest changes of acute appendicitis, but must be considered somewhat equivocal. Close clinical and laboratory surveillance advised. Surgical consultation is felt to be warranted given this circumstance. Appendix: Location: Appendix arises medially and inferiorly to the cecum with extension into the mid pelvis. Diameter: 9 mm Appendicolith: None Mucosal hyper-enhancement: None Extraluminal gas: None Periappendiceal collection: None. No appreciable periappendiceal soft tissue stranding. 2. Mild fluid in the cul-de-sac. Question recent ovarian cyst rupture. 3.  No bowel obstruction.  No abscess in the abdomen or pelvis. 4.  No evident renal or ureteral calculus.  No hydronephrosis. Critical Value/emergent results were called by telephone at the time of interpretation on 12/06/2018 at 10:31 am to Dr. Loren RacerAVID YELVERTON , who verbally acknowledged these results. Electronically Signed   By: Bretta BangWilliam  Woodruff III M.D.   On: 12/06/2018 10:32      Assessment and Plan:     1. Depression, unspecified depression type -continue Buspar Will increase Prozac to 40 mg  Meds ordered this encounter  Medications   FLUoxetine (PROZAC) 40 MG capsule    Sig: Take 1 capsule (40 mg total) by mouth daily.    Dispense:  30 capsule    Refill:  3    Order Specific Question:   Supervising Provider    Answer:   Despina HiddenEURE, LUTHER H [[id number]]   busPIRone (BUSPAR) 10 MG tablet    Sig: Take 1 tablet (10 mg total) by mouth 2 (two) times daily.    Dispense:  60 tablet    Refill:  5    Order Specific Question:   Supervising Provider    Answer:   Duane LopeEURE, LUTHER H [[id number]]  F/U in 6 weeks or sooner if needed        I discussed the assessment and treatment plan with the patient. The patient was provided  an opportunity to ask questions and all were answered. The patient agreed with the plan and demonstrated an understanding of the instructions.   The patient was advised to call back or seek an in-person evaluation/go to the ED if the symptoms worsen or if the condition fails to improve as anticipated.  I provided 6 minutes of non-face-to-face time during this encounter.   Cyril MourningJennifer Gurtej Noyola, NP Center for [facility], [facility]

## 2018-12-22 ENCOUNTER — Ambulatory Visit: Payer: Self-pay | Admitting: Adult Health

## 2019-01-31 ENCOUNTER — Encounter: Payer: Self-pay | Admitting: *Deleted

## 2019-02-01 ENCOUNTER — Ambulatory Visit (INDEPENDENT_AMBULATORY_CARE_PROVIDER_SITE_OTHER): Payer: BC Managed Care – PPO | Admitting: Adult Health

## 2019-02-01 ENCOUNTER — Encounter: Payer: Self-pay | Admitting: Adult Health

## 2019-02-01 ENCOUNTER — Other Ambulatory Visit: Payer: Self-pay

## 2019-02-01 DIAGNOSIS — F329 Major depressive disorder, single episode, unspecified: Secondary | ICD-10-CM | POA: Diagnosis not present

## 2019-02-01 DIAGNOSIS — Z3044 Encounter for surveillance of vaginal ring hormonal contraceptive device: Secondary | ICD-10-CM

## 2019-02-01 DIAGNOSIS — F32A Depression, unspecified: Secondary | ICD-10-CM

## 2019-02-01 MED ORDER — BUTALBITAL-APAP-CAFFEINE 50-325-40 MG PO TABS: 1.0000 | ORAL_TABLET | Freq: Four times a day (QID) | ORAL | 0 refills | Status: AC | PRN

## 2019-02-01 MED ORDER — ETONOGESTREL-ETHINYL ESTRADIOL 0.12-0.015 MG/24HR VA RING: VAGINAL_RING | VAGINAL | 11 refills | Status: DC

## 2019-02-01 MED ORDER — FLUOXETINE HCL 40 MG PO CAPS: 40.0000 mg | ORAL_CAPSULE | Freq: Every day | ORAL | 12 refills | Status: DC

## 2019-02-01 MED ORDER — BUSPIRONE HCL 10 MG PO TABS: 10.0000 mg | ORAL_TABLET | Freq: Two times a day (BID) | ORAL | 12 refills | Status: DC

## 2019-02-01 NOTE — Progress Notes (Signed)
Patient ID: Lajean ManesBrianna H Peasley, female   DOB: [birth year], [age] y.o.   MRN: [id number]   TELEHEALTH VIRTUAL GYNECOLOGY VISIT ENCOUNTER NOTE  I connected with Lajean ManesBrianna H Rath on 02/01/19 at 10:00 AM EDT by telephone at home and verified that I am speaking with the correct person using two identifiers.   I discussed the limitations, risks, security and privacy concerns of performing an evaluation and management service by telephone and the availability of in person appointments. I also discussed with the patient that there may be a patient responsible charge related to this service. The patient expressed understanding and agreed to proceed.   History:  Lajean ManesBrianna H Rouch is a 23 y.o. G0P0000 white female,single being evaluated today for depression and feels good on current meds.She needs refills on nuvra ring.  Patient denies any daily headaches, hearing loss, fatigue, blurred vision, shortness of breath, chest pain, abdominal pain, problems with bowel movements, urination, or intercourse. No joint pain or mood swings or other concerns. She works at [employer] [place] and was tested for [employer] [employer] and was negative.   [facility] is [employer].   Past Medical History:  Diagnosis Date  . Abnormal uterine bleeding (AUB) 03/11/2015  . Atopic dermatitis   . Depression   . Herpes simplex virus (HSV) infection    HSV1   Past Surgical History:  Procedure Laterality Date  . TONSILECTOMY/ADENOIDECTOMY WITH MYRINGOTOMY  2013  . WISDOM TOOTH EXTRACTION  04/2018   The following portions of the patient's history were reviewed and updated as appropriate: allergies, current medications, past family history, past medical history, past social history, past surgical history and problem list.   Health Maintenance:  Normal pap 10/19/18.  Review of Systems:  Pertinent items noted in HPI and remainder of comprehensive ROS otherwise negative.  Physical Exam:   General:  Alert, oriented and cooperative.   Mental Status:  Normal mood and affect perceived. Normal judgment and thought content.  Physical exam deferred due to nature of the encounter PHQ 2 score 0.   Labs and Imaging No results found for this or any previous visit (from the past 336 hour(s)). No results found.    Assessment and Plan:     1. Depression, unspecified depression type -continue prozac and buspar Meds ordered this encounter  Medications  . busPIRone (BUSPAR) 10 MG tablet    Sig: Take 1 tablet (10 mg total) by mouth 2 (two) times daily.    Dispense:  60 tablet    Refill:  12    Order Specific Question:   Supervising Provider    Answer:   Despina HiddenEURE, LUTHER H [[id number]]  . butalbital-acetaminophen-caffeine (FIORICET) 50-325-40 MG tablet    Sig: Take 1-2 tablets by mouth every 6 (six) hours as needed for headache.    Dispense:  20 tablet    Refill:  0    Order Specific Question:   Supervising Provider    Answer:   Despina HiddenEURE, LUTHER H [[id number]]  . FLUoxetine (PROZAC) 40 MG capsule    Sig: Take 1 capsule (40 mg total) by mouth daily.    Dispense:  30 capsule    Refill:  12    Order Specific Question:   Supervising Provider    Answer:   Despina HiddenEURE, LUTHER H [[id number]]  . etonogestrel-ethinyl estradiol (NUVARING) 0.12-0.015 MG/24HR vaginal ring    Sig: INSERT 1 RING VAGINALLY. REMOVE AND DISCARD AFTER 3 WEEKS. INSERT NEWRING ONE WEEK LATER.    Dispense:  3 each    Refill:  11    Order Specific Question:   Supervising Provider    Answer:   Tania Ade H [[id number]]    2. Encounter for surveillance of vaginal ring hormonal contraceptive device -continue nuva ring  Return in February for physical or sooner if needed       I discussed the assessment and treatment plan with the patient. The patient was provided an opportunity to ask questions and all were answered. The patient agreed with the plan and demonstrated an understanding of the instructions.   The patient was advised to call back or seek an in-person evaluation/go to the ED if the symptoms worsen or  if the condition fails to improve as anticipated.  I provided 5 minutes of non-face-to-face time during this encounter.   Derrek Monaco, NP Center for [employer], [place]

## 2019-02-08 ENCOUNTER — Other Ambulatory Visit: Payer: Self-pay | Admitting: Adult Health

## 2019-02-08 MED ORDER — SULFAMETHOXAZOLE-TRIMETHOPRIM 800-160 MG PO TABS: 1.0000 | ORAL_TABLET | Freq: Two times a day (BID) | ORAL | 0 refills | Status: DC

## 2019-02-08 NOTE — Progress Notes (Signed)
rx septra ds for UTI 

## 2019-03-27 ENCOUNTER — Other Ambulatory Visit: Payer: Self-pay | Admitting: Adult Health

## 2019-03-27 MED ORDER — PAROXETINE HCL 10 MG PO TABS: 10.0000 mg | ORAL_TABLET | Freq: Every day | ORAL | 2 refills | Status: DC

## 2019-03-27 NOTE — Progress Notes (Signed)
Will add paxil for OCD

## 2019-04-26 ENCOUNTER — Other Ambulatory Visit: Payer: Self-pay

## 2019-04-26 DIAGNOSIS — Z20822 Contact with and (suspected) exposure to covid-19: Secondary | ICD-10-CM

## 2019-04-27 ENCOUNTER — Other Ambulatory Visit: Payer: Self-pay

## 2019-04-27 DIAGNOSIS — Z20822 Contact with and (suspected) exposure to covid-19: Secondary | ICD-10-CM

## 2019-04-27 LAB — NOVEL CORONAVIRUS, NAA: SARS-CoV-2, NAA: NOT DETECTED

## 2019-04-28 LAB — NOVEL CORONAVIRUS, NAA: SARS-CoV-2, NAA: NOT DETECTED

## 2019-05-29 ENCOUNTER — Ambulatory Visit: Payer: BC Managed Care – PPO | Admitting: Family Medicine

## 2019-05-29 ENCOUNTER — Other Ambulatory Visit: Payer: Self-pay

## 2019-05-31 ENCOUNTER — Other Ambulatory Visit: Payer: Self-pay

## 2019-05-31 DIAGNOSIS — Z20822 Contact with and (suspected) exposure to covid-19: Secondary | ICD-10-CM

## 2019-06-02 LAB — NOVEL CORONAVIRUS, NAA: SARS-CoV-2, NAA: NOT DETECTED

## 2019-06-04 ENCOUNTER — Other Ambulatory Visit: Payer: Self-pay

## 2019-06-04 ENCOUNTER — Emergency Department (HOSPITAL_COMMUNITY)
Admission: EM | Admit: 2019-06-04 | Discharge: 2019-06-04 | Disposition: A | Discharge status: Home / Self Care | Payer: No Typology Code available for payment source | Attending: Emergency Medicine | Admitting: Emergency Medicine

## 2019-06-04 ENCOUNTER — Encounter (HOSPITAL_COMMUNITY): Payer: Self-pay | Admitting: Emergency Medicine

## 2019-06-04 DIAGNOSIS — Y92129 Unspecified place in nursing home as the place of occurrence of the external cause: Secondary | ICD-10-CM | POA: Insufficient documentation

## 2019-06-04 DIAGNOSIS — W461XXA Contact with contaminated hypodermic needle, initial encounter: Secondary | ICD-10-CM | POA: Diagnosis not present

## 2019-06-04 DIAGNOSIS — S61031A Puncture wound without foreign body of right thumb without damage to nail, initial encounter: Secondary | ICD-10-CM | POA: Diagnosis not present

## 2019-06-04 DIAGNOSIS — Y990 Civilian activity done for income or pay: Secondary | ICD-10-CM | POA: Diagnosis not present

## 2019-06-04 DIAGNOSIS — Z23 Encounter for immunization: Secondary | ICD-10-CM | POA: Diagnosis not present

## 2019-06-04 DIAGNOSIS — Y93F9 Activity, other caregiving: Secondary | ICD-10-CM | POA: Diagnosis not present

## 2019-06-04 DIAGNOSIS — Z79899 Other long term (current) drug therapy: Secondary | ICD-10-CM | POA: Diagnosis not present

## 2019-06-04 DIAGNOSIS — Z7721 Contact with and (suspected) exposure to potentially hazardous body fluids: Secondary | ICD-10-CM

## 2019-06-04 LAB — RAPID HIV SCREEN (HIV 1/2 AB+AG)
HIV 1/2 Antibodies: NONREACTIVE
HIV-1 P24 Antigen - HIV24: NONREACTIVE

## 2019-06-04 MED ORDER — TETANUS-DIPHTH-ACELL PERTUSSIS 5-2.5-18.5 LF-MCG/0.5 IM SUSP
0.5000 mL | Freq: Once | INTRAMUSCULAR | Status: AC
  Administered 2019-06-04: 0.5 mL via INTRAMUSCULAR
  Filled 2019-06-04: qty 0.5

## 2019-06-04 NOTE — ED Notes (Signed)
Dirty stick to thumb at work   Here for lab and eval

## 2019-06-04 NOTE — ED Provider Notes (Signed)
[facility] EMERGENCY DEPARTMENT Provider Note   CSN: [id number] Arrival date & time: 06/04/19  1922     History   Chief Complaint Chief Complaint  Patient presents with  . Body Fluid Exposure    HPI Brianna Banks is a 23 y.o. female.     The history is provided by the patient.  Body Fluid Exposure Type of exposure:  Needle stick Exposure location:  Finger Finger exposure location:  R thumb Time since exposure:  4 hours Context:  Patient care (she was stuck by an insulin needle when trying to snap the shield on the syringe) Tetanus immunization status:  Out of date Patient immunocompromised: no   Known source: yes   Source HIV status:  Negative (pt is a resident in nursing facility, memory care unitl.  No known h/o hepatitis or HIV) Source hepatitis B status:  Negative Source hepatitis C status:  Negative STD concern: no    She cleaned the wound with soap and water and used alcohol swab.  She reports it bled fairly well after the initial puncture.  Past Medical History:  Diagnosis Date  . Abnormal uterine bleeding (AUB) 03/11/2015  . Atopic dermatitis   . Depression   . Herpes simplex virus (HSV) infection    HSV1    Patient Active Problem List   Diagnosis Date Noted  . Encounter for surveillance of vaginal ring hormonal contraceptive device 02/01/2019  . Depression 09/29/2018  . Encounter for gynecological examination with Papanicolaou smear of cervix 09/29/2018  . Anxiety 05/15/2015  . OCD (obsessive compulsive disorder) 05/15/2015  . Abnormal uterine bleeding (AUB) 03/11/2015    Past Surgical History:  Procedure Laterality Date  . TONSILECTOMY/ADENOIDECTOMY WITH MYRINGOTOMY  2013  . WISDOM TOOTH EXTRACTION  04/2018     OB History    Gravida  0   Para  0   Term  0   Preterm  0   AB  0   Living  0     SAB  0   TAB  0   Ectopic  0   Multiple  0   Live Births               Home Medications    Prior to Admission  medications   Medication Sig Start Date End Date Taking? Authorizing Provider  busPIRone (BUSPAR) 10 MG tablet Take 1 tablet (10 mg total) by mouth 2 (two) times daily. 02/01/19   Estill Dooms, NP  butalbital-acetaminophen-caffeine (FIORICET) [phone] MG tablet Take 1-2 tablets by mouth every 6 (six) hours as needed for headache. 02/01/19 02/01/20  Estill Dooms, NP  etonogestrel-ethinyl estradiol (NUVARING) 0.12-0.015 MG/24HR vaginal ring INSERT 1 RING VAGINALLY. REMOVE AND DISCARD AFTER 3 WEEKS. INSERT NEWRING ONE WEEK LATER. 02/01/19   Estill Dooms, NP  FLUoxetine (PROZAC) 40 MG capsule Take 1 capsule (40 mg total) by mouth daily. 02/01/19   Estill Dooms, NP  PARoxetine (PAXIL) 10 MG tablet Take 1 tablet (10 mg total) by mouth daily. 03/27/19 03/26/20  Estill Dooms, NP  sulfamethoxazole-trimethoprim (BACTRIM DS) 800-160 MG tablet Take 1 tablet by mouth 2 (two) times daily. Take 1 bid 02/08/19   Estill Dooms, NP    Family History Family History  Problem Relation Age of Onset  . Arthritis Mother   . Other Mother        nerve damage in back  . Hypertension Father   . Other Father  back pain; nerve damage  . Cancer Maternal Aunt   . Other Maternal Aunt        horse shoe kidney  . Bipolar disorder Maternal Aunt   . Depression Maternal Aunt   . Cancer Maternal Uncle   . Diabetes Paternal Aunt   . COPD Paternal Aunt   . Arthritis Maternal Grandmother   . Hypertension Maternal Grandmother   . COPD Maternal Grandmother   . Diabetes Maternal Grandmother   . Depression Maternal Grandmother   . Stroke Maternal Grandmother   . Other Maternal Grandmother        restless leg; neuropathy; adrenal gland stopped working; B12 def.  . Cancer Maternal Grandfather   . Cancer Paternal Grandmother   . GER disease Paternal Grandmother   . Hypertension Paternal Grandmother   . Diabetes Paternal Grandfather   . Heart attack Paternal Grandfather   . Other  Paternal Grandfather        brain aneursym  . Depression Maternal Aunt   . Diabetes Maternal Aunt        Prediabetes  . Other Maternal Aunt        restless leg  . Bipolar disorder Maternal Aunt     Social History Social History   Tobacco Use  . Smoking status: Never Smoker  . Smokeless tobacco: Never Used  Substance Use Topics  . Alcohol use: No  . Drug use: No     Allergies   Aspirin and Cefprozil   Review of Systems Review of Systems  Constitutional: Negative.   HENT: Negative.   Respiratory: Negative.   Cardiovascular: Negative.   Gastrointestinal: Negative.   Musculoskeletal: Negative.   Skin: Positive for wound.     Physical Exam Updated Vital Signs BP 120/78 (BP Location: Right Arm)   Pulse (!) 109   Temp 98.1 F (36.7 C) (Oral)   Resp 16   LMP 05/21/2019   SpO2 100%   Physical Exam Vitals signs and nursing note reviewed.  Constitutional:      Appearance: She is well-developed.  HENT:     Head: Normocephalic and atraumatic.  Eyes:     Conjunctiva/sclera: Conjunctivae normal.  Neck:     Musculoskeletal: Normal range of motion.  Cardiovascular:     Rate and Rhythm: Tachycardia present.     Pulses: Normal pulses.  Pulmonary:     Effort: Pulmonary effort is normal.  Musculoskeletal: Normal range of motion.  Skin:    General: Skin is warm and dry.     Comments: Right thumb normal exam, unable to locate puncture.  Neurological:     Mental Status: She is alert.      ED Treatments / Results  Labs (all labs ordered are listed, but only abnormal results are displayed) Labs Reviewed  RAPID HIV SCREEN (HIV 1/2 AB+AG)  HEPATITIS PANEL, ACUTE    EKG None  Radiology No results found.  Procedures Procedures (including critical care time)  Medications Ordered in ED Medications  Tdap (BOOSTRIX) injection 0.5 mL (has no administration in time range)     Initial Impression / Assessment and Plan / ED Course  I have reviewed the triage  vital signs and the nursing notes.  Pertinent labs & imaging results that were available during my care of the patient were reviewed by me and considered in my medical decision making (see chart for details).        HIV and hepatitis panels collected.  Low risk of infection in the source, although suggested pt  discuss with employer having the resident screened as well if she does not have h/o of negative screenings for these infections. Tetanus updated.   Final Clinical Impressions(s) / ED Diagnoses   Final diagnoses:  Exposure to body fluids by contaminated hypodermic needle stick    ED Discharge Orders    None       Victoriano Laindol, Bessie Boyte, PA-C 06/04/19 2044    Gerhard MunchLockwood, Robert, MD 06/04/19 2210

## 2019-06-04 NOTE — ED Notes (Signed)
Call to lab  

## 2019-06-04 NOTE — Discharge Instructions (Signed)
Watch your Mychart for your test results - you should also be notified by [place] if any of your tests are positive.  I recommend following up with your companies Occupational Health group for any further recommendations.  It sounds like this was a low risk exposure which is reassuring, however, I would inquire about having the resident  (source patient) screened as well.

## 2019-06-04 NOTE — ED Triage Notes (Signed)
Pt states she was sent by work after being stuck with a needle in her left thumb. Pt here for blood draw.

## 2019-06-05 LAB — HEPATITIS PANEL, ACUTE
HCV Ab: NONREACTIVE
Hep A IgM: NONREACTIVE
Hep B C IgM: NONREACTIVE
Hepatitis B Surface Ag: NONREACTIVE

## 2019-06-28 ENCOUNTER — Encounter: Payer: Self-pay | Admitting: Family Medicine

## 2019-06-28 ENCOUNTER — Other Ambulatory Visit: Payer: Self-pay

## 2019-06-28 ENCOUNTER — Ambulatory Visit (INDEPENDENT_AMBULATORY_CARE_PROVIDER_SITE_OTHER): Payer: BC Managed Care – PPO | Admitting: Family Medicine

## 2019-06-28 DIAGNOSIS — K529 Noninfective gastroenteritis and colitis, unspecified: Secondary | ICD-10-CM

## 2019-06-28 DIAGNOSIS — Z20822 Contact with and (suspected) exposure to covid-19: Secondary | ICD-10-CM

## 2019-06-28 MED ORDER — HYOSCYAMINE SULFATE SL 0.125 MG SL SUBL: SUBLINGUAL_TABLET | SUBLINGUAL | 0 refills | Status: DC

## 2019-06-28 NOTE — Progress Notes (Signed)
   Subjective:    Patient ID: Brianna Banks, female    DOB: Jul 13, 1996, 23 y.o.   MRN: [id number]  Diarrhea  This is a new problem. Episode onset: 3 days. She has tried nothing for the symptoms.   Past three days  Also felt a bit achy.  No obvious fever.  Some abdominal cramps particularly when diarrhea occurs.  No headache no chest pain no cough no sore throat no fever   Review of Systems  Gastrointestinal: Positive for diarrhea.       Objective:   Physical Exam   Virtual     Assessment & Plan:  Impression acute gastroenteritis.  Discussed.  May be simple viral.  However could also be coronavirus discussed.  Levsin as needed for cramps.  Symptom care discussed.  COVID-19 encouraged

## 2019-06-29 LAB — NOVEL CORONAVIRUS, NAA: SARS-CoV-2, NAA: NOT DETECTED

## 2019-07-03 ENCOUNTER — Encounter: Payer: Self-pay | Admitting: Family Medicine

## 2019-07-03 NOTE — Telephone Encounter (Signed)
Stinging feeling started within the past week. About 3 -4 days ago.  Has been pretty constant. Just in legs. Feels like pins and needles.

## 2019-07-04 ENCOUNTER — Other Ambulatory Visit: Payer: Self-pay

## 2019-07-04 DIAGNOSIS — Z20822 Contact with and (suspected) exposure to covid-19: Secondary | ICD-10-CM

## 2019-07-08 LAB — NOVEL CORONAVIRUS, NAA: SARS-CoV-2, NAA: NOT DETECTED

## 2019-09-18 ENCOUNTER — Other Ambulatory Visit: Payer: Self-pay

## 2019-09-18 ENCOUNTER — Ambulatory Visit
Admission: EM | Admit: 2019-09-18 | Discharge: 2019-09-18 | Disposition: A | Discharge status: Home / Self Care | Payer: Self-pay | Attending: Emergency Medicine | Admitting: Emergency Medicine

## 2019-09-18 DIAGNOSIS — M79605 Pain in left leg: Secondary | ICD-10-CM

## 2019-09-18 MED ORDER — GABAPENTIN 100 MG PO CAPS: 100.0000 mg | ORAL_CAPSULE | Freq: Three times a day (TID) | ORAL | 0 refills | Status: DC

## 2019-09-18 MED ORDER — ACETAMINOPHEN 325 MG PO TABS: 650.0000 mg | ORAL_TABLET | Freq: Four times a day (QID) | ORAL | 0 refills | Status: DC | PRN

## 2019-09-18 NOTE — ED Triage Notes (Signed)
Pt developed left knee pain on Thursday  Then began radiating to left calf , denies known injury

## 2019-09-18 NOTE — Discharge Instructions (Signed)
Advised patient to take medication as prescribed To follow-up with primary care Return for worsening of symptoms

## 2019-09-18 NOTE — ED Provider Notes (Signed)
[facility]    CSN: [id number] Arrival date & time: 09/18/19  1547      History   Chief Complaint Chief Complaint  Patient presents with  . Leg Pain    HPI Brianna Banks is a 24 y.o. female.   Complains of left leg pain that began 5 days ago.  Reports the pain started in the left knee and radiated to the left calf.  Denies a precipitating event.  She  localizes the pain to the left leg and left calf.  Has history of low back pain.  She describes the pain as  achy and  tingling.  She has tried OTC medications without relief. Her symptoms are made worse with ROM and sleeping.  She denies similar symptoms in the past.    The history is provided by the patient. No language interpreter was used.  Leg Pain   Past Medical History:  Diagnosis Date  . Abnormal uterine bleeding (AUB) 03/11/2015  . Atopic dermatitis   . Depression   . Herpes simplex virus (HSV) infection    HSV1    Patient Active Problem List   Diagnosis Date Noted  . Encounter for surveillance of vaginal ring hormonal contraceptive device 02/01/2019  . Depression 09/29/2018  . Encounter for gynecological examination with Papanicolaou smear of cervix 09/29/2018  . Anxiety 05/15/2015  . OCD (obsessive compulsive disorder) 05/15/2015  . Abnormal uterine bleeding (AUB) 03/11/2015    Past Surgical History:  Procedure Laterality Date  . TONSILECTOMY/ADENOIDECTOMY WITH MYRINGOTOMY  2013  . WISDOM TOOTH EXTRACTION  04/2018    OB History    Gravida  0   Para  0   Term  0   Preterm  0   AB  0   Living  0     SAB  0   TAB  0   Ectopic  0   Multiple  0   Live Births               Home Medications    Prior to Admission medications   Medication Sig Start Date End Date Taking? Authorizing Provider  acetaminophen (TYLENOL) 325 MG tablet Take 2 tablets (650 mg total) by mouth every 6 (six) hours as needed. 09/18/19   Dionicia Cerritos, Zachery Dakins, FNP  busPIRone (BUSPAR) 10 MG tablet Take 1  tablet (10 mg total) by mouth 2 (two) times daily. Patient not taking: Reported on 06/28/2019 02/01/19   Adline Potter, NP  butalbital-acetaminophen-caffeine (FIORICET) [phone] MG tablet Take 1-2 tablets by mouth every 6 (six) hours as needed for headache. Patient not taking: Reported on 06/28/2019 02/01/19 02/01/20  Adline Potter, NP  etonogestrel-ethinyl estradiol (NUVARING) 0.12-0.015 MG/24HR vaginal ring INSERT 1 RING VAGINALLY. REMOVE AND DISCARD AFTER 3 WEEKS. INSERT NEWRING ONE WEEK LATER. 02/01/19   Adline Potter, NP  FLUoxetine (PROZAC) 40 MG capsule Take 1 capsule (40 mg total) by mouth daily. Patient not taking: Reported on 06/28/2019 02/01/19   Cyril Mourning A, NP  gabapentin (NEURONTIN) 100 MG capsule Take 1 capsule (100 mg total) by mouth 3 (three) times daily for 15 days. 09/18/19 10/03/19  Ronit Cranfield, Zachery Dakins, FNP  Hyoscyamine Sulfate SL (LEVSIN/SL) 0.125 MG SUBL Take one every 6 hours prn abdominal cramping 06/28/19   Merlyn Albert, MD  PARoxetine (PAXIL) 10 MG tablet Take 1 tablet (10 mg total) by mouth daily. Patient not taking: Reported on 06/28/2019 03/27/19 03/26/20  Adline Potter, NP  sulfamethoxazole-trimethoprim (BACTRIM DS) 800-160 MG  tablet Take 1 tablet by mouth 2 (two) times daily. Take 1 bid Patient not taking: Reported on 06/28/2019 02/08/19   Estill Dooms, NP    Family History Family History  Problem Relation Age of Onset  . Arthritis Mother   . Other Mother        nerve damage in back  . Hypertension Father   . Other Father        back pain; nerve damage  . Cancer Maternal Aunt   . Other Maternal Aunt        horse shoe kidney  . Bipolar disorder Maternal Aunt   . Depression Maternal Aunt   . Cancer Maternal Uncle   . Diabetes Paternal Aunt   . COPD Paternal 82   . Arthritis Maternal Grandmother   . Hypertension Maternal Grandmother   . COPD Maternal Grandmother   . Diabetes Maternal Grandmother   . Depression Maternal  Grandmother   . Stroke Maternal Grandmother   . Other Maternal Grandmother        restless leg; neuropathy; adrenal gland stopped working; B12 def.  . Cancer Maternal Grandfather   . Cancer Paternal Grandmother   . GER disease Paternal Grandmother   . Hypertension Paternal Grandmother   . Diabetes Paternal Grandfather   . Heart attack Paternal Grandfather   . Other Paternal Grandfather        brain aneursym  . Depression Maternal Aunt   . Diabetes Maternal Aunt        Prediabetes  . Other Maternal Aunt        restless leg  . Bipolar disorder Maternal Aunt     Social History Social History   Tobacco Use  . Smoking status: Never Smoker  . Smokeless tobacco: Never Used  Substance Use Topics  . Alcohol use: No  . Drug use: No     Allergies   Aspirin and Cefprozil   Review of Systems Review of Systems  Constitutional: Negative.   Respiratory: Negative.   Cardiovascular: Negative.   Musculoskeletal:       Leg pain  All other systems reviewed and are negative.    Physical Exam Triage Vital Signs ED Triage Vitals  Enc Vitals Group     BP      Pulse      Resp      Temp      Temp src      SpO2      Weight      Height      Head Circumference      Peak Flow      Pain Score      Pain Loc      Pain Edu?      Excl. in [place]?    No data found.  Updated Vital Signs BP 117/78   Pulse 97   Temp 98.1 F (36.7 C)   Resp 18   LMP 09/09/2019   SpO2 97%   Visual Acuity Right Eye Distance:   Left Eye Distance:   Bilateral Distance:    Right Eye Near:   Left Eye Near:    Bilateral Near:     Physical Exam Vitals and nursing note reviewed.  Constitutional:      General: She is not in acute distress.    Appearance: Normal appearance. She is normal weight. She is not ill-appearing, toxic-appearing or diaphoretic.  Cardiovascular:     Rate and Rhythm: Normal rate and regular rhythm.     Pulses:  Normal pulses.     Heart sounds: Normal heart sounds. No  murmur. No gallop.   Pulmonary:     Effort: Pulmonary effort is normal. No respiratory distress.     Breath sounds: Normal breath sounds. No stridor. No wheezing, rhonchi or rales.  Chest:     Chest wall: No tenderness.  Musculoskeletal:        General: No swelling, tenderness, deformity or signs of injury. Normal range of motion.     Right lower leg: Normal. No swelling, deformity, lacerations, tenderness or bony tenderness. No edema.     Left lower leg: Normal. No swelling, deformity, lacerations, tenderness or bony tenderness. No edema.     Comments: The left extremity is without deformity or asymmetry when compared to the right.  No overlying erythema, warmth, discoloration.  No lesion or break in skin integrity.  Neurological:     Mental Status: She is alert.      UC Treatments / Results  Labs (all labs ordered are listed, but only abnormal results are displayed) Labs Reviewed - No data to display  EKG   Radiology No results found.  Procedures Procedures (including critical care time)  Medications Ordered in UC Medications - No data to display  Initial Impression / Assessment and Plan / UC Course  I have reviewed the triage vital signs and the nursing notes.  Pertinent labs & imaging results that were available during my care of the patient were reviewed by me and considered in my medical decision making (see chart for details).    Patient stable for discharge Tylenol prescribed for pain Gabapentin for tingling sensation Follow-up with primary care To return for worsening of symptoms  Final Clinical Impressions(s) / UC Diagnoses   Final diagnoses:  Pain in left leg     Discharge Instructions     Advised patient to take medication as prescribed To follow-up with primary care Return for worsening of symptoms    ED Prescriptions    Medication Sig Dispense Auth. Provider   acetaminophen (TYLENOL) 325 MG tablet Take 2 tablets (650 mg total) by mouth  every 6 (six) hours as needed. 30 tablet Wenda Vanschaick, Zachery Dakins, FNP   gabapentin (NEURONTIN) 100 MG capsule Take 1 capsule (100 mg total) by mouth 3 (three) times daily for 15 days. 45 capsule Mallisa Alameda, Zachery Dakins, FNP     PDMP not reviewed this encounter.   Durward Parcel, FNP 09/18/19 1643

## 2020-01-16 ENCOUNTER — Telehealth: Payer: Self-pay | Admitting: Family Medicine

## 2020-01-16 NOTE — Telephone Encounter (Signed)
Vaccine record up front for pick up. Patient notified.

## 2020-01-16 NOTE — Telephone Encounter (Signed)
Pt is needing copy of her shot record.

## 2020-02-08 ENCOUNTER — Other Ambulatory Visit: Payer: Self-pay | Admitting: Obstetrics & Gynecology

## 2020-03-14 ENCOUNTER — Telehealth: Payer: Self-pay | Admitting: Adult Health

## 2020-03-14 NOTE — Telephone Encounter (Signed)
Pt is calling for Lab resutls from yesterday

## 2020-03-19 ENCOUNTER — Ambulatory Visit: Payer: Self-pay | Attending: Critical Care Medicine

## 2020-03-19 DIAGNOSIS — Z23 Encounter for immunization: Secondary | ICD-10-CM

## 2020-03-19 NOTE — Progress Notes (Signed)
   Covid-19 Vaccination Clinic  Name:  LORRI FUKUHARA    MRN: [id number] DOB: February 10, 1996  03/19/2020  Ms. Jimenez was observed post Covid-19 immunization for 15 minutes without incident. She was provided with Vaccine Information Sheet and instruction to access the V-Safe system.   Ms. Diosdado was instructed to call 911 with any severe reactions post vaccine: Marland Kitchen Difficulty breathing  . Swelling of face and throat  . A fast heartbeat  . A bad rash all over body  . Dizziness and weakness   Immunizations Administered    Name Date Dose VIS Date Route   Pfizer COVID-19 Vaccine 03/19/2020  3:07 PM 0.3 mL 10/18/2018 Intramuscular   Manufacturer: [employer], [employer]   Lot: [id number]   NDC: [address]-0

## 2020-03-27 ENCOUNTER — Other Ambulatory Visit: Payer: Self-pay | Admitting: Obstetrics & Gynecology

## 2020-04-02 ENCOUNTER — Encounter: Payer: Self-pay | Admitting: Family Medicine

## 2020-04-02 ENCOUNTER — Other Ambulatory Visit: Payer: Self-pay

## 2020-04-02 ENCOUNTER — Ambulatory Visit (INDEPENDENT_AMBULATORY_CARE_PROVIDER_SITE_OTHER): Payer: 59 | Admitting: Family Medicine

## 2020-04-02 VITALS — BP 102/64 | HR 94 | Temp 98.0°F | Resp 16 | Ht 63.5 in | Wt 145.0 lb

## 2020-04-02 DIAGNOSIS — Z0000 Encounter for general adult medical examination without abnormal findings: Secondary | ICD-10-CM | POA: Diagnosis not present

## 2020-04-02 DIAGNOSIS — Z113 Encounter for screening for infections with a predominantly sexual mode of transmission: Secondary | ICD-10-CM | POA: Diagnosis not present

## 2020-04-02 DIAGNOSIS — Z23 Encounter for immunization: Secondary | ICD-10-CM | POA: Diagnosis not present

## 2020-04-02 LAB — POCT URINALYSIS DIPSTICK
Spec Grav, UA: 1.025 (ref 1.010–1.025)
pH, UA: 5 (ref 5.0–8.0)

## 2020-04-02 LAB — POCT HEMOGLOBIN: Hemoglobin: 12.8 g/dL (ref 11–14.6)

## 2020-04-02 NOTE — Progress Notes (Signed)
Patient ID: Brianna Banks, female    DOB: 06-15-1996, 24 y.o.   MRN: [id number]   Chief Complaint  Patient presents with  . Annual Exam   Subjective:    HPI  pt needs form filled out for school. No problems or concerns to be addressed.   Medical History Brianna Banks has a past medical history of Abnormal uterine bleeding (AUB) (03/11/2015), Atopic dermatitis, Depression, and Herpes simplex virus (HSV) infection.   Outpatient Encounter Medications as of 04/02/2020  Medication Sig  . etonogestrel-ethinyl estradiol (NUVARING) 0.12-0.015 MG/24HR vaginal ring INSERT 1 RING VAGINALLY, REMOVE AND DISCARD AFTER 3 WEEKS, INSERT NEW RING 1 WEEK LATER  . FLUoxetine (PROZAC) 40 MG capsule TAKE 1 CAPSULE BY MOUTH EVERY DAY  . [DISCONTINUED] acetaminophen (TYLENOL) 325 MG tablet Take 2 tablets (650 mg total) by mouth every 6 (six) hours as needed.  . [DISCONTINUED] busPIRone (BUSPAR) 10 MG tablet Take 1 tablet (10 mg total) by mouth 2 (two) times daily. (Patient not taking: Reported on 06/28/2019)  . [DISCONTINUED] gabapentin (NEURONTIN) 100 MG capsule Take 1 capsule (100 mg total) by mouth 3 (three) times daily for 15 days.  . [DISCONTINUED] Hyoscyamine Sulfate SL (LEVSIN/SL) 0.125 MG SUBL Take one every 6 hours prn abdominal cramping  . [DISCONTINUED] PARoxetine (PAXIL) 10 MG tablet Take 1 tablet (10 mg total) by mouth daily. (Patient not taking: Reported on 06/28/2019)  . [DISCONTINUED] sulfamethoxazole-trimethoprim (BACTRIM DS) 800-160 MG tablet Take 1 tablet by mouth 2 (two) times daily. Take 1 bid (Patient not taking: Reported on 06/28/2019)   No facility-administered encounter medications on file as of 04/02/2020.     Review of Systems  Constitutional: Negative.   HENT: Negative.   Eyes: Negative.   Respiratory: Negative.   Cardiovascular: Negative.   Gastrointestinal: Negative.   Endocrine: Negative.   Genitourinary: Negative.   Musculoskeletal: Negative.   Skin: Negative.     Allergic/Immunologic: Negative.   Neurological: Negative.   Hematological: Negative.   Psychiatric/Behavioral: Negative.      Vitals BP 102/64   Pulse 94   Temp 98 F (36.7 C)   Resp 16   Ht 5' 3.5" (1.613 m)   Wt 145 lb (65.8 kg)   SpO2 100%   BMI 25.28 kg/m   Objective:   Physical Exam Vitals and nursing note reviewed.  Constitutional:      Appearance: Normal appearance.  Cardiovascular:     Rate and Rhythm: Normal rate and regular rhythm.     Pulses: Normal pulses.     Heart sounds: Normal heart sounds.  Pulmonary:     Effort: Pulmonary effort is normal.     Breath sounds: Normal breath sounds.  Chest:     Breasts: Tanner Score is 5.        Right: Normal.        Left: Normal.  Abdominal:     General: Abdomen is flat. Bowel sounds are normal.     Palpations: Abdomen is soft.  Genitourinary:    Comments: Deferred. Last PAP 2020. No GU symptoms today. Musculoskeletal:        General: Normal range of motion.  Skin:    General: Skin is warm and dry.  Neurological:     Mental Status: She is alert and oriented to person, place, and time.  Psychiatric:        Mood and Affect: Mood normal.        Behavior: Behavior normal.      Assessment and Plan  1. Routine general medical examination at a health care facility - POCT urinalysis dipstick - POCT hemoglobin - Td vaccine greater than or equal to 7yo preservative free IM  2. Need for vaccination - Td vaccine greater than or equal to 7yo preservative free IM   Brianna Banks presents today for preventative check-up for college. She reports she is sexually active with same partner x 3 years. No risky behaviors identified. She will give urine for GC/Chlymdia today per guidelines for age. Uses seatbelt every time. Uses alcohol minimally, never drives while impaired or rides with someone else who has been drinking. She is studying respiratory therapy at [employer]. Fluoxetine is managing her mild anxiety. No concerns today.     GYN manages her Nuvaring and Dr. Despina Hidden manages her Fluoxetine.   Immunizations reviewed. Td given today.   She will follow-up in one year or sooner if anything changes. She understands and agrees with plan of care today.  Brianna Bodo, NP  04/02/2020

## 2020-04-02 NOTE — Patient Instructions (Signed)
Preventive Care 21-23 Years Old, Female Preventive care refers to visits with your health care provider and lifestyle choices that can promote health and wellness. This includes:  A yearly physical exam. This may also be called an annual well check.  Regular dental visits and eye exams.  Immunizations.  Screening for certain conditions.  Healthy lifestyle choices, such as eating a healthy diet, getting regular exercise, not using drugs or products that contain nicotine and tobacco, and limiting alcohol use. What can I expect for my preventive care visit? Physical exam Your health care provider will check your:  Height and weight. This may be used to calculate body mass index (BMI), which tells if you are at a healthy weight.  Heart rate and blood pressure.  Skin for abnormal spots. Counseling Your health care provider may ask you questions about your:  Alcohol, tobacco, and drug use.  Emotional well-being.  Home and relationship well-being.  Sexual activity.  Eating habits.  Work and work environment.  Method of birth control.  Menstrual cycle.  Pregnancy history. What immunizations do I need?  Influenza (flu) vaccine  This is recommended every year. Tetanus, diphtheria, and pertussis (Tdap) vaccine  You may need a Td booster every 10 years. Varicella (chickenpox) vaccine  You may need this if you have not been vaccinated. Human papillomavirus (HPV) vaccine  If recommended by your health care provider, you may need three doses over 6 months. Measles, mumps, and rubella (MMR) vaccine  You may need at least one dose of MMR. You may also need a second dose. Meningococcal conjugate (MenACWY) vaccine  One dose is recommended if you are age 19-21 years and a first-year college student living in a residence hall, or if you have one of several medical conditions. You may also need additional booster doses. Pneumococcal conjugate (PCV13) vaccine  You may need  this if you have certain conditions and were not previously vaccinated. Pneumococcal polysaccharide (PPSV23) vaccine  You may need one or two doses if you smoke cigarettes or if you have certain conditions. Hepatitis A vaccine  You may need this if you have certain conditions or if you travel or work in places where you may be exposed to hepatitis A. Hepatitis B vaccine  You may need this if you have certain conditions or if you travel or work in places where you may be exposed to hepatitis B. Haemophilus influenzae type b (Hib) vaccine  You may need this if you have certain conditions. You may receive vaccines as individual doses or as more than one vaccine together in one shot (combination vaccines). Talk with your health care provider about the risks and benefits of combination vaccines. What tests do I need?  Blood tests  Lipid and cholesterol levels. These may be checked every 5 years starting at age 20.  Hepatitis C test.  Hepatitis B test. Screening  Diabetes screening. This is done by checking your blood sugar (glucose) after you have not eaten for a while (fasting).  Sexually transmitted disease (STD) testing.  BRCA-related cancer screening. This may be done if you have a family history of breast, ovarian, tubal, or peritoneal cancers.  Pelvic exam and Pap test. This may be done every 3 years starting at age 21. Starting at age 30, this may be done every 5 years if you have a Pap test in combination with an HPV test. Talk with your health care provider about your test results, treatment options, and if necessary, the need for more tests.   Follow these instructions at home: Eating and drinking   Eat a diet that includes fresh fruits and vegetables, whole grains, lean protein, and low-fat dairy.  Take vitamin and mineral supplements as recommended by your health care provider.  Do not drink alcohol if: ? Your health care provider tells you not to drink. ? You are  pregnant, may be pregnant, or are planning to become pregnant.  If you drink alcohol: ? Limit how much you have to 0-1 drink a day. ? Be aware of how much alcohol is in your drink. In the [place] one drink equals one 12 oz bottle of beer (355 mL), one 5 oz glass of wine (148 mL), or one 1 oz glass of hard liquor (44 mL). Lifestyle  Take daily care of your teeth and gums.  Stay active. Exercise for at least 30 minutes on 5 or more days each week.  Do not use any products that contain nicotine or tobacco, such as cigarettes, e-cigarettes, and chewing tobacco. If you need help quitting, ask your health care provider.  If you are sexually active, practice safe sex. Use a condom or other form of birth control (contraception) in order to prevent pregnancy and STIs (sexually transmitted infections). If you plan to become pregnant, see your health care provider for a preconception visit. What's next?  Visit your health care provider once a year for a well check visit.  Ask your health care provider how often you should have your eyes and teeth checked.  Stay up to date on all vaccines. This information is not intended to replace advice given to you by your health care provider. Make sure you discuss any questions you have with your health care provider. Document Revised: 04/21/2018 Document Reviewed: 04/21/2018 Elsevier Patient Education  2020 [employer].

## 2020-04-05 ENCOUNTER — Encounter: Payer: Self-pay | Admitting: Family Medicine

## 2020-04-09 ENCOUNTER — Ambulatory Visit: Payer: Self-pay | Attending: Family Medicine

## 2020-04-09 DIAGNOSIS — Z23 Encounter for immunization: Secondary | ICD-10-CM

## 2020-04-09 NOTE — Progress Notes (Signed)
   Covid-19 Vaccination Clinic  Name:  Brianna Banks    MRN: [id number] DOB: December 01, 1995  04/09/2020  [name] was observed post Covid-19 immunization for 15 minutes without incident. She was provided with Vaccine Information Sheet and instruction to access the V-Safe system.   [name] was instructed to call 911 with any severe reactions post vaccine: Marland Kitchen Difficulty breathing  . Swelling of face and throat  . A fast heartbeat  . A bad rash all over body  . Dizziness and weakness   Immunizations Administered    Name Date Dose VIS Date Route   Pfizer COVID-19 Vaccine 04/09/2020 12:35 PM 0.3 mL 10/18/2018 Intramuscular   Manufacturer: [employer], [employer]   Lot: [id number]   NDC: [address]-7

## 2020-04-15 ENCOUNTER — Encounter: Payer: Self-pay | Admitting: Family Medicine

## 2020-05-22 ENCOUNTER — Encounter: Payer: Self-pay | Admitting: Family Medicine

## 2020-05-23 NOTE — Telephone Encounter (Signed)
Please schedule pt an appt with karen

## 2020-05-30 ENCOUNTER — Other Ambulatory Visit: Payer: Self-pay

## 2020-05-30 ENCOUNTER — Ambulatory Visit (INDEPENDENT_AMBULATORY_CARE_PROVIDER_SITE_OTHER): Payer: 59 | Admitting: Family Medicine

## 2020-05-30 ENCOUNTER — Encounter: Payer: Self-pay | Admitting: Family Medicine

## 2020-05-30 VITALS — BP 122/70 | HR 118 | Temp 97.6°F | Ht 63.5 in | Wt 146.0 lb

## 2020-05-30 DIAGNOSIS — F909 Attention-deficit hyperactivity disorder, unspecified type: Secondary | ICD-10-CM

## 2020-05-30 DIAGNOSIS — F900 Attention-deficit hyperactivity disorder, predominantly inattentive type: Secondary | ICD-10-CM | POA: Insufficient documentation

## 2020-05-30 MED ORDER — METHYLPHENIDATE HCL ER (CD) 20 MG PO CPCR: 20.0000 mg | ORAL_CAPSULE | ORAL | 0 refills | Status: DC

## 2020-05-30 NOTE — Patient Instructions (Signed)

## 2020-05-30 NOTE — Progress Notes (Signed)
Patient ID: Brianna Banks, female    DOB: [birth year], 24 y.o.   MRN: [id number]   Chief Complaint  Patient presents with  . focus issues   Subjective:  Cc: can't stay focused on school at home and at school  HPI  trouble focusing in school. Has noticed this year she cannot stay focused, easily distracted, and procrastinates on school assignments. Grades are not the best and has stress related to that. If she makes a really bad grade she may get kicked out of the program. Wants to discuss medication management for ADD.   Medical History Elka has a past medical history of Abnormal uterine bleeding (AUB) (03/11/2015), Atopic dermatitis, Depression, and Herpes simplex virus (HSV) infection.   Outpatient Encounter Medications as of 05/30/2020  Medication Sig  . etonogestrel-ethinyl estradiol (NUVARING) 0.12-0.015 MG/24HR vaginal ring INSERT 1 RING VAGINALLY, REMOVE AND DISCARD AFTER 3 WEEKS, INSERT NEW RING 1 WEEK LATER  . [DISCONTINUED] FLUoxetine (PROZAC) 40 MG capsule TAKE 1 CAPSULE BY MOUTH EVERY DAY  . methylphenidate (METADATE CD) 20 MG CR capsule Take 1 capsule (20 mg total) by mouth every morning.   No facility-administered encounter medications on file as of 05/30/2020.     Review of Systems  Constitutional: Negative.   HENT: Negative.   Eyes: Negative.   Respiratory: Negative.   Cardiovascular: Negative.   Endocrine: Negative.   Genitourinary: Negative.   Musculoskeletal: Negative.   Neurological: Negative.   Hematological: Negative.   Psychiatric/Behavioral:       Inability to focus and pay attention in school.     Vitals BP 122/70   Pulse (!) 118   Temp 97.6 F (36.4 C)   Ht 5' 3.5" (1.613 m)   Wt 146 lb (66.2 kg)   SpO2 99%   BMI 25.46 kg/m   Objective:   Physical Exam Cardiovascular:     Rate and Rhythm: Regular rhythm.     Heart sounds: Normal heart sounds.  Pulmonary:     Effort: Pulmonary effort is normal.     Breath sounds: Normal  breath sounds.  Neurological:     Mental Status: She is oriented to person, place, and time.  Psychiatric:        Mood and Affect: Mood normal.        Behavior: Behavior normal.        Thought Content: Thought content normal.        Judgment: Judgment normal.      Assessment and Plan   1. Attention deficit hyperactivity disorder (ADHD), unspecified ADHD type - methylphenidate (METADATE CD) 20 MG CR capsule; Take 1 capsule (20 mg total) by mouth every morning.  Dispense: 30 capsule; Refill: 0   Marcelina presents today to discuss medication management for attention issues. She is attending community college for respiratory therapy. This is a stressful and she is noticing that she is not staying focused, overlooking details when taking tests, listening but not really hearing what is said, and she procrastinates to complete her assignments. She reports she has OCD and does have ability to stay somewhat organized, but dreads starting and completing lengthy assignments.   I consulted with Dr. Gerda Diss during this visit. We will do a therapeutic trial of a long acting ADD medication with follow-up in 3 weeks. If this does not improve her symptoms, will refer to neuro psych for management.   Agrees with plan of care discussed today. Understands warning signs to seek further care: any changes in her health status,  suicidal thoughts.  Understands to follow-up in 3 weeks to assess treatment effectiveness.   Dorena Bodo, FNP-C 05/30/2020

## 2020-06-20 ENCOUNTER — Encounter: Payer: Self-pay | Admitting: Family Medicine

## 2020-06-20 ENCOUNTER — Other Ambulatory Visit: Payer: Self-pay

## 2020-06-20 ENCOUNTER — Ambulatory Visit (INDEPENDENT_AMBULATORY_CARE_PROVIDER_SITE_OTHER): Payer: 59 | Admitting: Family Medicine

## 2020-06-20 VITALS — BP 122/78 | HR 117 | Temp 94.6°F | Ht 63.5 in | Wt 145.6 lb

## 2020-06-20 DIAGNOSIS — F900 Attention-deficit hyperactivity disorder, predominantly inattentive type: Secondary | ICD-10-CM | POA: Diagnosis not present

## 2020-06-20 MED ORDER — METHYLPHENIDATE HCL ER (CD) 40 MG PO CPCR: 40.0000 mg | ORAL_CAPSULE | ORAL | 0 refills | Status: DC

## 2020-06-20 NOTE — Progress Notes (Signed)
Patient was seen today for ADD checkup.  This patient does have ADD.  Patient takes medications for this.  If this does help control overall symptoms.  Please see below. -weight, vital signs reviewed.  The following items were covered. -Compliance with medication : Methylphenidate 20 mg once in the morning.   -Problems with completing homework, paying attention/taking good notes in school: unable to focus in school. Pt in RT program   -grades: grades being affected due to inability to focus  - Eating patterns : eats pretty good  -sleeping: wakes up a lot during the night. Wakes up about 3 times a night but can go right back to sleep   -Additional issues or questions: pt states she can feel the medicine wear off.  Pt having constant headaches. Pt allergic to aspirin and that is the only thing that takes headache away.     Patient ID: Brianna Banks, female    DOB: 10/29/1995, 24 y.o.   MRN: [id number]   Chief Complaint  Patient presents with  . ADHD   Subjective:  CC: follow-up on ADD medication  Presents today for follow-up on her ADD medication.  This was started on October 7, and she is not positive that is helping that much.  She usually takes the dose around 7 AM, and feels that the effects are wearing off before she leaves school.  She reports that she is still sleepy, and can fall asleep at the drop of a hat.  She usually wakes up a few times during the night, this is not new, she easily falls back to sleep.  She wanted to discuss what she could take for her headaches also.  It is not certain that this medication is improving her attentiveness issues at school.    Medical History Brianna Banks has a past medical history of Abnormal uterine bleeding (AUB) (03/11/2015), Atopic dermatitis, Depression, and Herpes simplex virus (HSV) infection.   Outpatient Encounter Medications as of 06/20/2020  Medication Sig  . etonogestrel-ethinyl estradiol (NUVARING) 0.12-0.015 MG/24HR vaginal  ring INSERT 1 RING VAGINALLY, REMOVE AND DISCARD AFTER 3 WEEKS, INSERT NEW RING 1 WEEK LATER  . methylphenidate (METADATE CD) 40 MG CR capsule Take 1 capsule (40 mg total) by mouth every morning.  . [DISCONTINUED] methylphenidate (METADATE CD) 20 MG CR capsule Take 1 capsule (20 mg total) by mouth every morning.   No facility-administered encounter medications on file as of 06/20/2020.     Review of Systems  Respiratory: Negative for chest tightness and shortness of breath.   Cardiovascular: Negative for chest pain.  Gastrointestinal: Negative for abdominal pain.  Genitourinary: Negative for dysuria and frequency.  Musculoskeletal: Negative for myalgias.  Neurological: Positive for headaches. Negative for dizziness, syncope, weakness and light-headedness.       Gets headaches every day, not new problem since this medication.  Psychiatric/Behavioral: Positive for decreased concentration and sleep disturbance. Negative for suicidal ideas.       Sleep disturbance not new problem. Can go back to sleep.     Vitals BP 122/78   Pulse (!) 117   Temp (!) 94.6 F (34.8 C)   Ht 5' 3.5" (1.613 m)   Wt 145 lb 9.6 oz (66 kg)   SpO2 100%   BMI 25.39 kg/m   Objective:   Physical Exam Constitutional:      General: She is not in acute distress.    Appearance: Normal appearance.  Cardiovascular:     Rate and Rhythm: Regular rhythm. Tachycardia present.  Heart sounds: Normal heart sounds.  Pulmonary:     Effort: Pulmonary effort is normal.     Breath sounds: Normal breath sounds.  Skin:    General: Skin is warm and dry.  Neurological:     Mental Status: She is alert and oriented to person, place, and time.  Psychiatric:        Mood and Affect: Mood normal.        Behavior: Behavior normal.        Thought Content: Thought content normal.        Judgment: Judgment normal.      Assessment and Plan   1. Attention deficit hyperactivity disorder (ADHD), predominantly inattentive  type - methylphenidate (METADATE CD) 40 MG CR capsule; Take 1 capsule (40 mg total) by mouth every morning.  Dispense: 30 capsule; Refill: 0   Will increase dose, if not effective will wean and discontinue after one more month.  It is uncertain whether this medication is making a difference in her attentiveness.  Will trial another month at an increased dose, if there is no significant difference, we will wean over a couple of weeks and discontinue this medication.  As far as for her headaches go, she does not believe that she has migraines, does not have photosensitivity, she is only taking ibuprofen and Aleve, I encouraged her to seek something at the drugstore that is designed for migraines and let me know how that works.  Agrees with plan of care discussed today. Understands warning signs to seek further care: Any significant changes in health status. Understands to follow-up in 1 month, sooner if anything changes.   Novella Olive, NP 06/20/2020

## 2020-06-20 NOTE — Patient Instructions (Addendum)
Increase dose for one month. If this does not manage your symptoms we will wean and discontinue.

## 2020-07-14 ENCOUNTER — Encounter: Payer: Self-pay | Admitting: Family Medicine

## 2020-07-23 ENCOUNTER — Encounter: Payer: Self-pay | Admitting: Family Medicine

## 2020-07-25 ENCOUNTER — Ambulatory Visit: Payer: 59 | Admitting: Family Medicine

## 2020-09-02 DIAGNOSIS — F429 Obsessive-compulsive disorder, unspecified: Secondary | ICD-10-CM | POA: Diagnosis not present

## 2020-09-02 DIAGNOSIS — F902 Attention-deficit hyperactivity disorder, combined type: Secondary | ICD-10-CM | POA: Diagnosis not present

## 2020-09-02 DIAGNOSIS — F339 Major depressive disorder, recurrent, unspecified: Secondary | ICD-10-CM | POA: Diagnosis not present

## 2020-09-02 DIAGNOSIS — F411 Generalized anxiety disorder: Secondary | ICD-10-CM | POA: Diagnosis not present

## 2020-09-02 DIAGNOSIS — F340 Cyclothymic disorder: Secondary | ICD-10-CM | POA: Diagnosis not present

## 2020-09-28 DIAGNOSIS — Z0100 Encounter for examination of eyes and vision without abnormal findings: Secondary | ICD-10-CM | POA: Diagnosis not present

## 2020-10-01 ENCOUNTER — Encounter: Payer: Self-pay | Admitting: Family Medicine

## 2020-10-01 NOTE — Telephone Encounter (Signed)
Nurses I am certainly open to helping Brianna Banks with this issue. To take over this medication I really would like to do a visit to establish a baseline and goals of treatment thank you  We can do a virtual visit with her either video or phone. I have several openings on Thursday morning. Please connect with her and see if that would work. Thanks-Dr. Lorin Picket

## 2020-11-15 ENCOUNTER — Encounter: Payer: Self-pay | Admitting: Family Medicine

## 2020-11-18 ENCOUNTER — Ambulatory Visit
Admission: EM | Admit: 2020-11-18 | Discharge: 2020-11-18 | Disposition: A | Discharge status: Home / Self Care | Payer: 59 | Attending: Family Medicine | Admitting: Family Medicine

## 2020-11-18 ENCOUNTER — Encounter: Payer: Self-pay | Admitting: Family Medicine

## 2020-11-18 ENCOUNTER — Other Ambulatory Visit: Payer: Self-pay

## 2020-11-18 ENCOUNTER — Encounter: Payer: Self-pay | Admitting: Emergency Medicine

## 2020-11-18 DIAGNOSIS — R0982 Postnasal drip: Secondary | ICD-10-CM | POA: Insufficient documentation

## 2020-11-18 DIAGNOSIS — K209 Esophagitis, unspecified without bleeding: Secondary | ICD-10-CM | POA: Insufficient documentation

## 2020-11-18 LAB — POCT RAPID STREP A (OFFICE): Rapid Strep A Screen: NEGATIVE

## 2020-11-18 MED ORDER — PSEUDOEPHEDRINE HCL 30 MG PO TABS: 30.0000 mg | ORAL_TABLET | ORAL | 0 refills | Status: DC | PRN

## 2020-11-18 MED ORDER — FAMOTIDINE 40 MG PO TABS: 40.0000 mg | ORAL_TABLET | Freq: Every day | ORAL | 0 refills | Status: DC

## 2020-11-18 MED ORDER — CETIRIZINE HCL 10 MG PO TABS: 10.0000 mg | ORAL_TABLET | Freq: Every day | ORAL | 0 refills | Status: DC

## 2020-11-18 NOTE — ED Triage Notes (Addendum)
Fever headache and sore throat.  S/s started on Thursday.  Little brother recently dx with strep throat.  Needs work note for Friday and today.

## 2020-11-18 NOTE — ED Provider Notes (Signed)
[facility]    CSN: [id number] Arrival date & time: 11/18/20  1520      History   Chief Complaint Chief Complaint  Patient presents with  . Sore Throat    HPI Brianna Banks is a 25 y.o. female.   HPI  Patient presents today for evaluation of sore throat and headache. Symptoms present x 4 days. Afebrile. Recent sick contact with brother who tested positive for strep. Pain with swallowing and pain lower throat  With enlarged lymph nodes.  Past Medical History:  Diagnosis Date  . Abnormal uterine bleeding (AUB) 03/11/2015  . Atopic dermatitis   . Depression   . Herpes simplex virus (HSV) infection    HSV1    Patient Active Problem List   Diagnosis Date Noted  . Attention deficit hyperactivity disorder (ADHD), predominantly inattentive type 05/30/2020  . Encounter for surveillance of vaginal ring hormonal contraceptive device 02/01/2019  . Depression 09/29/2018  . Encounter for gynecological examination with Papanicolaou smear of cervix 09/29/2018  . Anxiety 05/15/2015  . OCD (obsessive compulsive disorder) 05/15/2015  . Abnormal uterine bleeding (AUB) 03/11/2015    Past Surgical History:  Procedure Laterality Date  . TONSILECTOMY/ADENOIDECTOMY WITH MYRINGOTOMY  2013  . WISDOM TOOTH EXTRACTION  04/2018    OB History    Gravida  0   Para  0   Term  0   Preterm  0   AB  0   Living  0     SAB  0   IAB  0   Ectopic  0   Multiple  0   Live Births               Home Medications    Prior to Admission medications   Medication Sig Start Date End Date Taking? Authorizing Provider  etonogestrel-ethinyl estradiol (NUVARING) 0.12-0.015 MG/24HR vaginal ring INSERT 1 RING VAGINALLY, REMOVE AND DISCARD AFTER 3 WEEKS, INSERT NEW RING 1 WEEK LATER 03/27/20   Lazaro Arms, MD  methylphenidate (METADATE CD) 40 MG CR capsule Take 1 capsule (40 mg total) by mouth every morning. 06/20/20   Novella Olive, NP    Family History Family History   Problem Relation Age of Onset  . Arthritis Mother   . Other Mother        nerve damage in back  . Hypertension Father   . Other Father        back pain; nerve damage  . Cancer Maternal Aunt   . Other Maternal Aunt        horse shoe kidney  . Bipolar disorder Maternal Aunt   . Depression Maternal Aunt   . Cancer Maternal Uncle   . Diabetes Paternal Aunt   . COPD Paternal Aunt   . Arthritis Maternal Grandmother   . Hypertension Maternal Grandmother   . COPD Maternal Grandmother   . Diabetes Maternal Grandmother   . Depression Maternal Grandmother   . Stroke Maternal Grandmother   . Other Maternal Grandmother        restless leg; neuropathy; adrenal gland stopped working; B12 def.  . Cancer Maternal Grandfather   . Cancer Paternal Grandmother   . GER disease Paternal Grandmother   . Hypertension Paternal Grandmother   . Diabetes Paternal Grandfather   . Heart attack Paternal Grandfather   . Other Paternal Grandfather        brain aneursym  . Depression Maternal Aunt   . Diabetes Maternal Aunt  Prediabetes  . Other Maternal Aunt        restless leg  . Bipolar disorder Maternal Aunt     Social History Social History   Tobacco Use  . Smoking status: Never Smoker  . Smokeless tobacco: Never Used  Vaping Use  . Vaping Use: Every day  Substance Use Topics  . Alcohol use: No  . Drug use: No     Allergies   Aspirin and Cefprozil   Review of Systems Review of Systems Pertinent negatives listed in HPI   Physical Exam Triage Vital Signs ED Triage Vitals  Enc Vitals Group     BP 11/18/20 1629 103/68     Pulse Rate 11/18/20 1629 (!) 102     Resp 11/18/20 1629 17     Temp 11/18/20 1629 98.1 F (36.7 C)     Temp Source 11/18/20 1629 Oral     SpO2 11/18/20 1629 98 %     Weight --      Height --      Head Circumference --      Peak Flow --      Pain Score 11/18/20 1627 6     Pain Loc --      Pain Edu? --      Excl. in GC? --    No data  found.  Updated Vital Signs BP 103/68 (BP Location: Right Arm)   Pulse (!) 102   Temp 98.1 F (36.7 C) (Oral)   Resp 17   LMP 11/02/2020   SpO2 98%   Visual Acuity Right Eye Distance:   Left Eye Distance:   Bilateral Distance:    Right Eye Near:   Left Eye Near:    Bilateral Near:     Physical Exam Constitutional:      Appearance: She is well-developed.  HENT:     Head: Normocephalic and atraumatic.     Mouth/Throat:     Mouth: Mucous membranes are moist.     Pharynx: Oropharynx is clear. Uvula midline.     Tonsils: No tonsillar exudate.  Eyes:     Conjunctiva/sclera: Conjunctivae normal.  Musculoskeletal:     Cervical back: Normal range of motion. Normal range of motion.  Lymphadenopathy:     Cervical: Cervical adenopathy present.  Neurological:     Mental Status: She is alert.      UC Treatments / Results  Labs (all labs ordered are listed, but only abnormal results are displayed) Labs Reviewed  CULTURE, GROUP A STREP [facility])  POCT RAPID STREP A (OFFICE)    EKG   Radiology No results found.  Procedures Procedures (including critical care time)  Medications Ordered in UC Medications - No data to display  Initial Impression / Assessment and Plan / UC Course  I have reviewed the triage vital signs and the nursing notes.  Pertinent labs & imaging results that were available during my care of the patient were reviewed by me and considered in my medical decision making (see chart for details).    Acute esophagitis related to persistent PND. Treatment with famotidine 40 mg daily x 5 days Nasal symptoms, cetirizine and sudafed for management.  PCP follow-up as needed.   Final Clinical Impressions(s) / UC Diagnoses   Final diagnoses:  Post-nasal drainage  Acute esophagitis   Discharge Instructions   None    ED Prescriptions    Medication Sig Dispense Auth. Provider   famotidine (PEPCID) 40 MG tablet Take 1 tablet (40 mg total) by mouth  daily  for 5 days. 5 tablet Bing Neighbors, FNP   cetirizine (ZYRTEC) 10 MG tablet Take 1 tablet (10 mg total) by mouth at bedtime. 30 tablet Bing Neighbors, FNP   pseudoephedrine (SUDAFED) 30 MG tablet Take 1 tablet (30 mg total) by mouth every 4 (four) hours as needed for congestion. Avoid taking after 6 pm 30 tablet Bing Neighbors, FNP     PDMP not reviewed this encounter.   Bing Neighbors, [place] 11/20/20 2153

## 2020-11-21 LAB — CULTURE, GROUP A STREP (THRC)

## 2020-11-22 ENCOUNTER — Ambulatory Visit: Payer: 59 | Admitting: Nurse Practitioner

## 2020-12-11 ENCOUNTER — Encounter: Payer: Self-pay | Admitting: Family Medicine

## 2020-12-11 ENCOUNTER — Ambulatory Visit: Payer: 59 | Admitting: Family Medicine

## 2021-01-16 ENCOUNTER — Ambulatory Visit: Payer: 59 | Admitting: Family Medicine

## 2021-01-22 ENCOUNTER — Encounter: Payer: Self-pay | Admitting: Family Medicine

## 2021-01-22 ENCOUNTER — Ambulatory Visit: Payer: 59 | Admitting: Family Medicine

## 2021-01-22 ENCOUNTER — Other Ambulatory Visit: Payer: Self-pay

## 2021-01-22 ENCOUNTER — Other Ambulatory Visit (HOSPITAL_COMMUNITY): Payer: Self-pay

## 2021-01-22 VITALS — BP 108/76 | HR 89 | Temp 98.6°F | Ht 63.5 in | Wt 150.0 lb

## 2021-01-22 DIAGNOSIS — R519 Headache, unspecified: Secondary | ICD-10-CM

## 2021-01-22 MED ORDER — NURTEC 75 MG PO TBDP
75.0000 mg | ORAL_TABLET | ORAL | 1 refills | Status: DC | PRN
  Filled 2021-01-22: qty 15, 15d supply, fill #0
  Filled 2021-01-30: qty 8, 30d supply, fill #0
  Filled 2021-01-30: qty 15, 30d supply, fill #0

## 2021-01-22 NOTE — Progress Notes (Signed)
   Subjective:    Patient ID: Brianna Banks, female    DOB: 1996-08-10, 25 y.o.   MRN: [id number]  Headache  Episode onset: at least 6 months ago. Pain location: whole head. The quality of the pain is described as squeezing and throbbing. Treatments tried: aspirin, ibuprofen, tylenol, The treatment provided no relief.  pt states she has headache most days. Maybe one or two days a week she wont have a headache.    Started over 6 months ago Progressed to all day long Past month wake up with headache in the morning Wakes up at night and will still have headache  Have tried otc measures Had hives with goody powder due to aspirin  No vomiting  Had some headaches younger but no where near as now  occas vision fuzzy and hut hurts in the eyeballs Typical all over headache   Review of Systems  Neurological: Positive for headaches.  Patient denies being depressed or anxious does not utilize caffeine     Objective:   Physical Exam Pupils reactive EOMI Finger-to-nose is normal Romberg negative Optic disc appear sharp No neurodeficits Lungs clear heart regular       Assessment & Plan:  Frequent daily headaches Progressive Had what appeared to be a aneurysm back in 2015  It is hard to know if this is playing a role I believe the patient would benefit from MRI MRA  We will be connecting with neurology as well  Will try nurtec for her headaches.  Hold off on Topamax because it can interact with her birth control  It should be noted that I did communicate with specialist regarding this.  They did recommend MRI MRA-neurology.  This is because the patient has a history of aneurysm and also nocturnal headaches with worsening pattern over the past 6 months we will move forward with this.

## 2021-01-24 ENCOUNTER — Telehealth: Payer: Self-pay | Admitting: Family Medicine

## 2021-01-24 ENCOUNTER — Encounter: Payer: Self-pay | Admitting: Family Medicine

## 2021-01-24 DIAGNOSIS — I671 Cerebral aneurysm, nonruptured: Secondary | ICD-10-CM

## 2021-01-24 DIAGNOSIS — R519 Headache, unspecified: Secondary | ICD-10-CM

## 2021-01-24 NOTE — Telephone Encounter (Signed)
Left message to return call.  MRI/MRA orders placed.

## 2021-01-24 NOTE — Addendum Note (Signed)
Addended by: Marlowe Shores on: 01/24/2021 10:38 AM   Modules accepted: Orders

## 2021-01-24 NOTE — Telephone Encounter (Signed)
Patient's mother made aware of drs recommendations. She states will relay information to patient currently in class.

## 2021-01-24 NOTE — Telephone Encounter (Signed)
-----   Message from Anson Fret, MD sent at 01/22/2021 12:58 PM EDT ----- I think that the MRI/MRA is absolutely a great idea. I like Nurtec and I love the CGRP injectables like Ajovy/Emgality and the new Bennie Pierini is great as well. The problem will be getting these approved with her unless she has tried more meds than in her history on Epic;  usually patient has to fail at least 2 triptans for nurtec/ubrelvy acute and for the cgrp preventatives usually they have to fail a TCA, b-blocker and AED(usually topamax) unless you use the copay cards. Ajovy has the $5 copay card that may work for her If you want to try Ajovy. We are more than happy to see her whenever!   ----- Message ----- From: Babs Sciara, MD Sent: 01/22/2021  12:37 PM EDT To: Anson Fret, MD  Hi Brianna Banks  Clinical question This patient is having daily headaches.  Been going on for 6 months-past 2 months have become daily headaches-including waking her up at night and also at least 3 times per week waking up in the morning with a headache.    Back in 2015 she had some headaches that resolved at that time.  MRI in 2015 showed possible small aneurysm on MRA.  Her current condition is not emergent.  Given her situation currently I was trending toward the following #1 trying Nurtec to see if this might help with her headaches. #2 repeating MRI and MRA (although typically I would only do an MRI I am including the MRA because the findings from 2015) #3 after MRI trying daily preventative medicine if that does not help then consult/referral with you all  On the surface does that seem to be a reasonable approach? Thanks-Ryan Ogborn

## 2021-01-24 NOTE — Telephone Encounter (Signed)
Nurses I did discuss this case with Dr. Lucia Gaskins neurology at [facility]. This confirms the path we are taking  Please order MRI/MRA of the brain due to chronic headaches, nocturnal headaches, cerebral artery aneurysm  Please also touch base with the patient to inform her that I did communicate with neurology and they confirmed that MRI/MRA would be a good next step.  It should be noted that if this shows significant issues or patient's issues do not improve we will consult with a referral to specialist

## 2021-01-30 ENCOUNTER — Other Ambulatory Visit (HOSPITAL_COMMUNITY): Payer: Self-pay

## 2021-02-02 ENCOUNTER — Telehealth: Payer: Self-pay | Admitting: Family Medicine

## 2021-02-02 NOTE — Telephone Encounter (Signed)
Nurses  Her insurance is denying Nurtec currently. They are requesting that we try alternative medications.  Sumatriptan hand is covered. This is a commonly used medication for migraines. Medication is typically given as a 50 mg tablet taken at the first sign of severe migraine, may repeat in 2 hours, maximum 2 in a day, may have 8 tablets with 4 refills.  Hopefully this will be covered without insurance company causing issues  If any additional issues please let us know  The patient can give [place] feedback within a couple weeks how this medication is doing for her headaches.  Once we have the MRI that have been ordered for later this month we will be able to help guide where to go from there.

## 2021-02-03 ENCOUNTER — Other Ambulatory Visit (HOSPITAL_COMMUNITY): Payer: Self-pay

## 2021-02-03 MED ORDER — SUMATRIPTAN SUCCINATE 50 MG PO TABS
ORAL_TABLET | ORAL | 4 refills | Status: DC
  Filled 2021-02-03: qty 8, 30d supply, fill #0

## 2021-02-03 NOTE — Telephone Encounter (Signed)
Patient notified and verbalized understanding. 

## 2021-02-03 NOTE — Telephone Encounter (Signed)
Prescription sent electronically to pharmacy. Left message to return call to notify patient. 

## 2021-02-04 ENCOUNTER — Other Ambulatory Visit (HOSPITAL_COMMUNITY): Payer: Self-pay

## 2021-02-11 ENCOUNTER — Ambulatory Visit (HOSPITAL_COMMUNITY)
Admission: RE | Admit: 2021-02-11 | Discharge: 2021-02-11 | Disposition: A | Discharge status: Home / Self Care | Payer: 59 | Source: Ambulatory Visit | Attending: Family Medicine | Admitting: Family Medicine

## 2021-02-11 ENCOUNTER — Other Ambulatory Visit: Payer: Self-pay

## 2021-02-11 ENCOUNTER — Other Ambulatory Visit (HOSPITAL_COMMUNITY): Payer: Self-pay

## 2021-02-11 DIAGNOSIS — R519 Headache, unspecified: Secondary | ICD-10-CM | POA: Insufficient documentation

## 2021-02-11 DIAGNOSIS — I671 Cerebral aneurysm, nonruptured: Secondary | ICD-10-CM | POA: Insufficient documentation

## 2021-02-12 NOTE — Addendum Note (Signed)
Addended by: Margaretha Sheffield on: 02/12/2021 03:19 PM   Modules accepted: Orders

## 2021-03-03 ENCOUNTER — Encounter: Payer: Self-pay | Admitting: Family Medicine

## 2021-03-05 ENCOUNTER — Ambulatory Visit: Payer: 59 | Admitting: Family Medicine

## 2021-03-20 ENCOUNTER — Ambulatory Visit: Payer: 59 | Admitting: Neurology

## 2021-03-20 ENCOUNTER — Encounter: Payer: Self-pay | Admitting: Neurology

## 2021-03-20 ENCOUNTER — Other Ambulatory Visit: Payer: Self-pay

## 2021-03-20 ENCOUNTER — Ambulatory Visit (INDEPENDENT_AMBULATORY_CARE_PROVIDER_SITE_OTHER): Payer: 59 | Admitting: Neurology

## 2021-03-20 VITALS — BP 106/71 | HR 94 | Ht 63.5 in | Wt 150.0 lb

## 2021-03-20 DIAGNOSIS — G43711 Chronic migraine without aura, intractable, with status migrainosus: Secondary | ICD-10-CM | POA: Diagnosis not present

## 2021-03-20 MED ORDER — AMITRIPTYLINE HCL 25 MG PO TABS: 25.0000 mg | ORAL_TABLET | Freq: Every day | ORAL | 3 refills | Status: DC

## 2021-03-20 MED ORDER — METHYLPREDNISOLONE 4 MG PO TBPK: ORAL_TABLET | ORAL | 0 refills | Status: DC

## 2021-03-20 MED ORDER — SUMATRIPTAN SUCCINATE 50 MG PO TABS: ORAL_TABLET | ORAL | 4 refills | Status: DC

## 2021-03-20 NOTE — Patient Instructions (Signed)
Take the Medrol dose pack as prescribed  Then start with Elavil with 1/2 tab for one week, then increase to full tab thereafter  Take Sumatriptan as abortive medication as directed  Return to clinic in 3 months for follow up.

## 2021-03-20 NOTE — Progress Notes (Signed)
[facility]  PATIENT: Brianna Banks DOB: 1996-04-23  REFERRING CLINICIAN: Babs Sciara, MD HISTORY FROM: Patient  REASON FOR VISIT: Migraines Headaches    HISTORICAL  CHIEF COMPLAINT:  Chief Complaint  Patient presents with   New Patient (Initial Visit)    Room 13 - alone. Reports daily headaches, in varying degrees, over the last year. She has sumatriptan at home but has never tired it. No history of trying any preventive medications.    HISTORY OF PRESENT ILLNESS:  Is a 25 year old woman with no past medical history who is presenting for evaluation of her headaches.  Patient stated she is having headache since high school but lately has been getting worse.  For the past 6 months has been having almost daily headache.  Headaches are described as pressure behind eyes, throbbing pain, involve the whole head.  Sometimes she can feel the pain frontally.  No associated nausea or vomiting but there is associated photophobia.  Patient reported nothing seems to help with the pain and she has not tried any preventive medication in the past.   She was seen recently in the ED for headaches and had a MRI done which was within normal limits.  She has a family history of migraine in her mother and sister.   Headache History and Characteristics: Onset: Sine high school but getting worse  Location: All over head, more so behind eyes and frontal  Quality: Pressure behind eye,  throbbing.  Intensity: 6/10.  Duration: can last up to 3 to 4 days  Migrainous Features: Photophobia.  Aura: seeing small dots  History of brain injury or tumor: No  Previous Treatment:  Prophylactic: No  Abortive: No  Injections:No     Aura: yes  photophobia/ phonophobia: yes  nausea/ vomiting: no  Family history: Mother and sister  Motion sickness: no Cardiac history: no Caffeine: No  Sleep: Good  Mood/ Stress: Ok   Prior prophylaxis: No  Propranolol: No  Verapamil:No TCA:  No Topamax: No Depakote: No Effexor: No Cymbalta: No Neurontin:No  Prior abortives: Triptan: No Anti-emetic: No Steroids: No Ergotamine suppository: No  Prior intervention: No   REVIEW OF SYSTEMS: Full 14 system review of systems performed and negative with exception of: as noted in the HPI  ALLERGIES: Allergies  Allergen Reactions   Aspirin Hives    No ASA based products   Cefprozil Rash    Childhood-Cefzil    HOME MEDICATIONS: Outpatient Medications Prior to Visit  Medication Sig Dispense Refill   etonogestrel-ethinyl estradiol (NUVARING) 0.12-0.015 MG/24HR vaginal ring INSERT 1 RING VAGINALLY, REMOVE AND DISCARD AFTER 3 WEEKS, INSERT NEW RING 1 WEEK LATER 3 each 7   SUMAtriptan (IMITREX) 50 MG tablet Take one tablet at the onset of severe migraine. May repeat in 2 hours if headache persists or recurs. Max 2 a day 8 tablet 4   No facility-administered medications prior to visit.    PAST MEDICAL HISTORY: Past Medical History:  Diagnosis Date   Abnormal uterine bleeding (AUB) 03/11/2015   Atopic dermatitis    Depression    Frequent headaches    Herpes simplex virus (HSV) infection    HSV1    PAST SURGICAL HISTORY: Past Surgical History:  Procedure Laterality Date   TONSILECTOMY/ADENOIDECTOMY WITH MYRINGOTOMY  2013   WISDOM TOOTH EXTRACTION  04/2018    FAMILY HISTORY: Family History  Problem Relation Age of Onset   Arthritis Mother    Other Mother        nerve  damage in back   Hypertension Father    Other Father        back pain; nerve damage   Cancer Maternal Aunt    Other Maternal Aunt        horse shoe kidney   Bipolar disorder Maternal Aunt    Depression Maternal Aunt    Cancer Maternal Uncle    Diabetes Paternal Aunt    COPD Paternal Aunt    Arthritis Maternal Grandmother    Hypertension Maternal Grandmother    COPD Maternal Grandmother    Diabetes Maternal Grandmother    Depression Maternal Grandmother    Stroke Maternal Grandmother     Other Maternal Grandmother        restless leg; neuropathy; adrenal gland stopped working; B12 def.   Cancer Maternal Grandfather    Cancer Paternal Grandmother    GER disease Paternal Grandmother    Hypertension Paternal Grandmother    Diabetes Paternal Grandfather    Heart attack Paternal Grandfather    Other Paternal Grandfather        brain aneursym   Depression Maternal Aunt    Diabetes Maternal Aunt        Prediabetes   Other Maternal Aunt        restless leg   Bipolar disorder Maternal Aunt     SOCIAL HISTORY: Social History   Socioeconomic History   Marital status: Single    Spouse name: Not on file   Number of children: 0   Years of education: currently in college   Highest education level: Not on file  Occupational History   Occupation: CMA   Occupation: Archivistcollege student  Tobacco Use   Smoking status: Never   Smokeless tobacco: Never  Vaping Use   Vaping Use: Every day  Substance and Sexual Activity   Alcohol use: No   Drug use: No   Sexual activity: Not Currently    Birth control/protection: Other-see comments, Inserts    Comment: nuvaring  Other Topics Concern   Not on file  Social History Narrative   Lives with her mother.   Right-handed.   No daily use of caffeine.   Social Determinants of Health   Financial Resource Strain: Not on file  Food Insecurity: Not on file  Transportation Needs: Not on file  Physical Activity: Not on file  Stress: Not on file  Social Connections: Not on file  Intimate Partner Violence: Not on file     PHYSICAL EXAM  GENERAL EXAM/CONSTITUTIONAL: Vitals:  Vitals:   03/20/21 1136  BP: 106/71  Pulse: 94  Weight: 150 lb (68 kg)  Height: 5' 3.5" (1.613 m)   Body mass index is 26.15 kg/m. Wt Readings from Last 3 Encounters:  03/20/21 150 lb (68 kg)  01/22/21 150 lb (68 kg)  06/20/20 145 lb 9.6 oz (66 kg)   Patient is in no distress; well developed, nourished and groomed; neck is  supple  CARDIOVASCULAR: Examination of carotid arteries is normal; no carotid bruits Regular rate and rhythm, no murmurs Examination of peripheral vascular system by observation and palpation is normal  EYES: Pupils round and reactive to light, Visual fields full to confrontation, Extraocular movements intacts,   MUSCULOSKELETAL: Gait, strength, tone, movements noted in Neurologic exam below  NEUROLOGIC: MENTAL STATUS:  awake, alert, oriented to person, place and time recent and remote memory intact normal attention and concentration language fluent, comprehension intact, naming intact fund of knowledge appropriate  CRANIAL NERVE:  2nd - no papilledema or hemorrhages  on fundoscopic exam 2nd, 3rd, 4th, 6th - pupils equal and reactive to light, visual fields full to confrontation, extraocular muscles intact, no nystagmus 5th - facial sensation symmetric 7th - facial strength symmetric 8th - hearing intact 9th - palate elevates symmetrically, uvula midline 11th - shoulder shrug symmetric 12th - tongue protrusion midline  MOTOR:  normal bulk and tone, full strength in the BUE, BLE  SENSORY:  normal and symmetric to light touch  COORDINATION:  finger-nose-finger, fine finger movements normal  REFLEXES:  deep tendon reflexes present and symmetric  GAIT/STATION:  normal     DIAGNOSTIC DATA (LABS, IMAGING, TESTING) - I reviewed patient records, labs, notes, testing and imaging myself where available.  Lab Results  Component Value Date   WBC 6.2 12/06/2018   HGB 12.8 04/02/2020   HCT 44.7 12/06/2018   MCV 90.1 12/06/2018   PLT 173 12/06/2018      Component Value Date/Time   NA 137 12/06/2018 0923   NA 139 10/15/2016 0927   K 4.0 12/06/2018 0923   CL 106 12/06/2018 0923   CO2 24 12/06/2018 0923   GLUCOSE 83 12/06/2018 0923   BUN 6 12/06/2018 0923   BUN 8 10/15/2016 0927   CREATININE 0.76 12/06/2018 0923   CREATININE 0.75 09/11/2014 1638   CALCIUM 8.9  12/06/2018 0923   PROT 7.5 12/06/2018 0923   PROT 7.3 01/15/2017 0936   ALBUMIN 3.9 12/06/2018 0923   ALBUMIN 4.2 01/15/2017 0936   AST 20 12/06/2018 0923   ALT 16 12/06/2018 0923   ALKPHOS 47 12/06/2018 0923   BILITOT 0.5 12/06/2018 0923   BILITOT 0.2 01/15/2017 0936   GFRNONAA >60 12/06/2018 0923   GFRAA >60 12/06/2018 0923   No results found for: CHOL, HDL, LDLCALC, LDLDIRECT, TRIG, CHOLHDL No results found for: XBJY7W No results found for: VITAMINB12 Lab Results  Component Value Date   TSH 1.680 01/15/2017    MRI Brain reviewed 1. No acute intracranial abnormality. 2. Mild patchy T2/FLAIR signal abnormality involving the posterior/periatrial periventricular white matter, chronic and stable from previous. Finding could be developmental in nature or reflect sequelae of remote/perinatal insult. 3. Otherwise normal brain MRI.    ASSESSMENT AND PLAN  25 y.o. year old female here with evaluation of headaches.  She reported she is has been having headaches since high school but lately in the past year, the frequency has been getting worse.  She does also have a family history of migraine in her mother and sister.  She tried over-the-counter medication without any relief.  She has never been on a preventive medication.  Currently she has been having headache that started a couple days ago.  For her current migraine episode I will prescribe her a Medrol Dosepak to start today.  At the completion of this regimen she will be started on a preventive medication amitriptyline to take nightly.  She has been prescribed sumatriptan by her PMD but has not taken it. I recommended patient to take the sumatriptan as directed meaning to take it as soon as the headache start,  she can take a second dose within 2 hours but do not take more than 2 pills in a 24-hour period.  Patient voices understanding.  All questions and concerns addressed.  I will see the patient in 3 months for follow-up.   1.  Intractable chronic migraine without aura and with status migrainosus       PLAN: Take the Medrol dose pack as prescribed  Then start with Elavil  with 1/2 tab nightly for one week, then increase to full tab thereafter  Take Sumatriptan as abortive medication as directed  Return to clinic in 3 months for follow up.     No orders of the defined types were placed in this encounter.   Meds ordered this encounter  Medications   amitriptyline (ELAVIL) 25 MG tablet    Sig: Take 1 tablet (25 mg total) by mouth at bedtime.    Dispense:  90 tablet    Refill:  3   SUMAtriptan (IMITREX) 50 MG tablet    Sig: Take one tablet at the onset of severe migraine. May repeat in 2 hours if headache persists or recurs. Max 2 a day    Dispense:  9 tablet    Refill:  4   methylPREDNISolone (MEDROL DOSEPAK) 4 MG TBPK tablet    Sig: Merlene Laughter    Dispense:  21 tablet    Refill:  0    Return in about 3 months (around 06/20/2021).    Windell Norfolk, MD 03/20/2021, 12:22 PM  [facility] [address], [address] [place], [place] [address] [phone]

## 2021-03-24 ENCOUNTER — Telehealth: Payer: Self-pay | Admitting: Neurology

## 2021-03-24 NOTE — Telephone Encounter (Signed)
Pt said she was returning a phone call from someone. Pt said will call back tomorrow.

## 2021-03-24 NOTE — Telephone Encounter (Signed)
Pt called and LVM stating that the provider mentioned a low dose anti seizure medication and a low dose depression medication that is sometimes given to pt's with migraines but when she went to the pharmacy she was only given the low dose depression medication. Pt would like to know when or if she will be getting the low dose anti seizure medication. Please advise.

## 2021-03-25 ENCOUNTER — Ambulatory Visit: Payer: 59 | Admitting: Neurology

## 2021-03-25 NOTE — Addendum Note (Signed)
Addended by: Lilla Shook on: 03/25/2021 07:44 AM   Modules accepted: Orders

## 2021-03-25 NOTE — Telephone Encounter (Signed)
I spoke to the patient. She took her last dose from the Medrol dose pack sent in this morning. Her headache has improved. She wanted to clarify her preventive medication. She verbalized understanding that amitriptyline 25mg  at bedtime is taken daily to reduce the frequency of her migraines. She has sumatriptan 50mg  to take for rescue.

## 2021-04-20 ENCOUNTER — Other Ambulatory Visit: Payer: Self-pay | Admitting: Obstetrics & Gynecology

## 2021-05-27 ENCOUNTER — Encounter: Payer: Self-pay | Admitting: Family Medicine

## 2021-05-27 MED ORDER — NITROFURANTOIN MONOHYD MACRO 100 MG PO CAPS: 100.0000 mg | ORAL_CAPSULE | Freq: Two times a day (BID) | ORAL | 0 refills | Status: DC

## 2021-05-27 NOTE — Telephone Encounter (Signed)
Nurses Given her circumstances I am okay with Macrobid 100 mg 1 twice daily for 5 days  May use OTC Azo Standard if necessary Follow-up if any ongoing troubles

## 2021-05-27 NOTE — Addendum Note (Signed)
Addended by: Margaretha Sheffield on: 05/27/2021 05:09 PM   Modules accepted: Orders

## 2021-06-25 ENCOUNTER — Ambulatory Visit: Payer: 59 | Admitting: Neurology

## 2021-06-26 ENCOUNTER — Ambulatory Visit: Payer: 59 | Admitting: Neurology

## 2021-08-06 IMAGING — MR MR HEAD W/O CM
4 of 5 series · 19 of 25 positions shown · non-contrast
Comparison: No pertinent prior exam.
COMPARISON: Comparison made with previous MRIs from 12/29/2013.

CLINICAL DATA: Initial evaluation for chronic daily headaches for 6
months, increased frequency.

EXAM:
MRI HEAD WITHOUT CONTRAST
MRA HEAD WITHOUT CONTRAST
TECHNIQUE: Multiplanar, multi-echo pulse sequences of the brain and surrounding
structures were acquired without intravenous contrast. Angiographic
images of the Circle of Willis were acquired using MRA technique
without intravenous contrast.

[Series 1022: rotate · 0.34mm/px · 4 of 4 slices shown (1 of 2)]
[im 1/4]
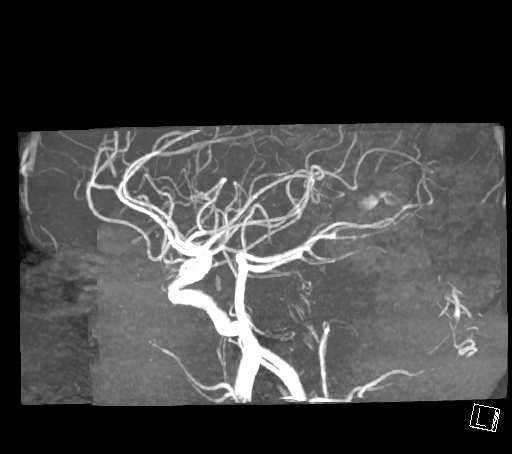
[im 2/4]
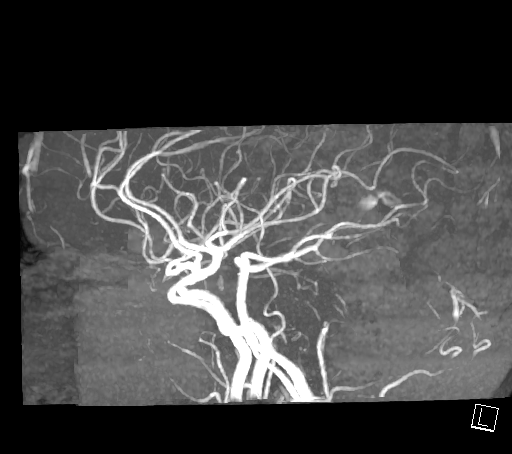
[im 3/4]
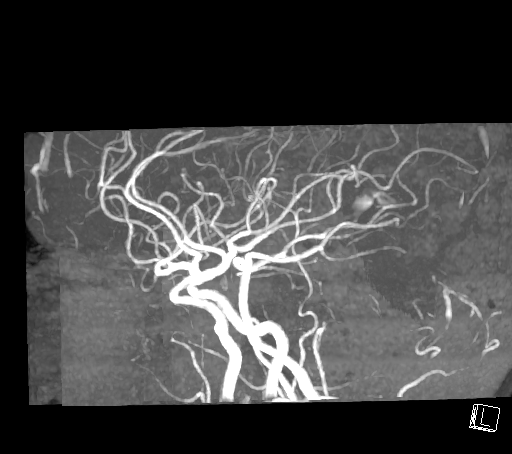
[im 4/4]
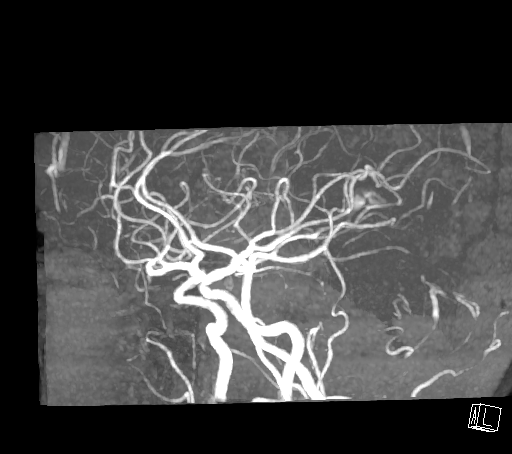

[Series 1027: tumble · 0.36mm/px · 5 of 5 slices shown (1 of 2)]
[im 1/5]
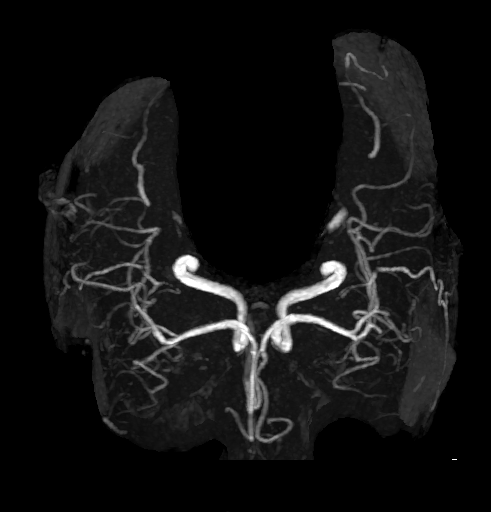
[im 2/5]
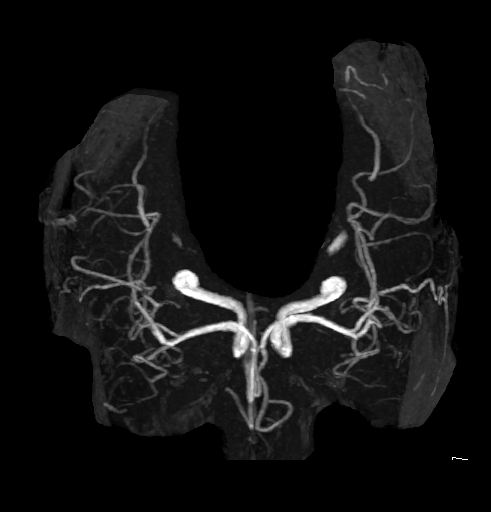
[im 3/5]
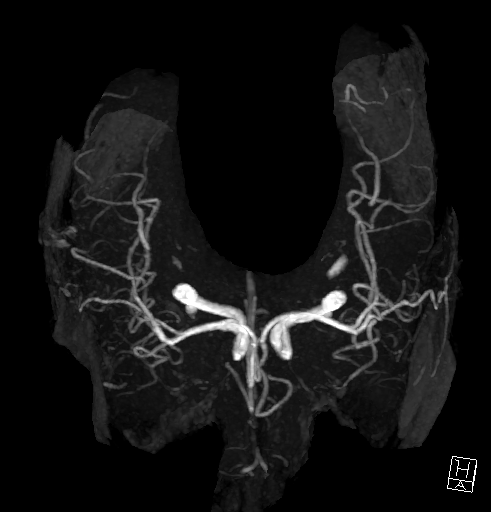
[im 4/5]
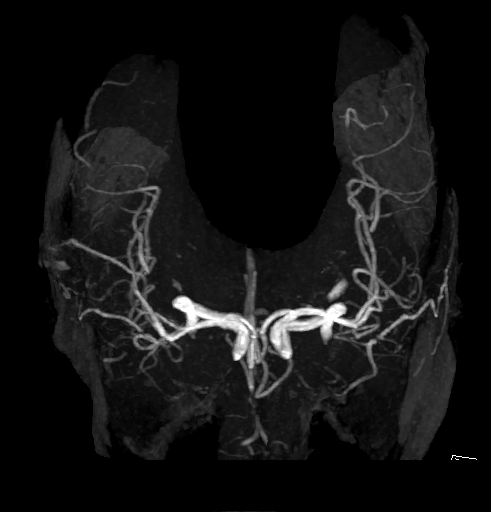
[im 5/5]
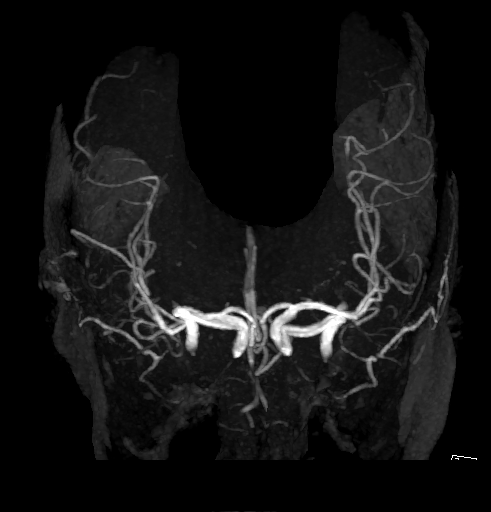

[Series 1031: rotate · 0.34mm/px · 4 of 4 slices shown (2 of 2)]
[im 1/4]
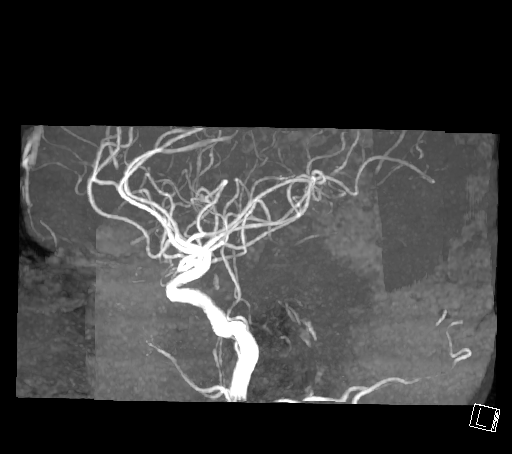
[im 2/4]
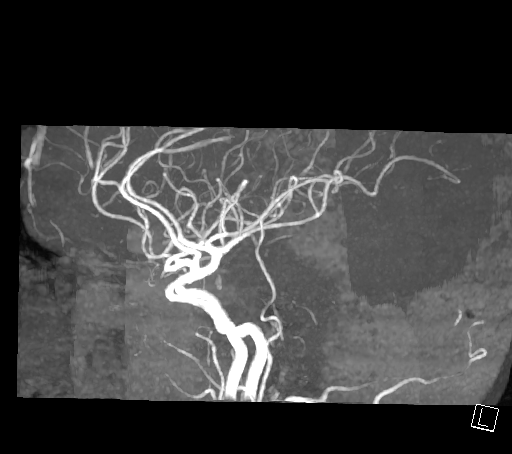
[im 3/4]
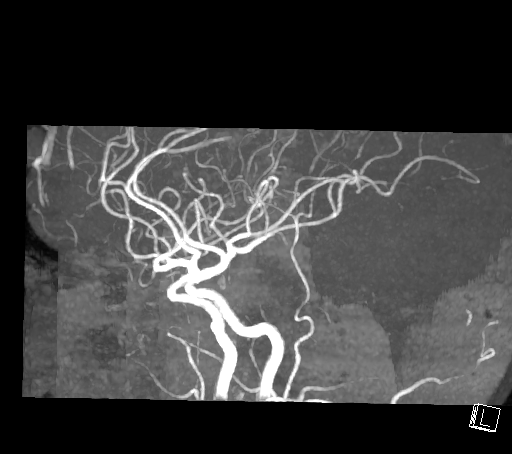
[im 4/4]
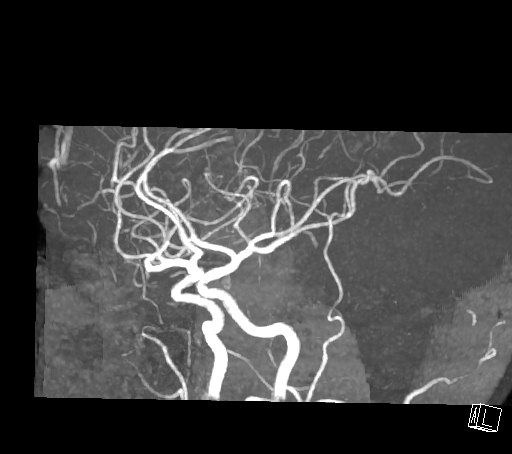

[Series 1035: tumble · 0.34mm/px · 6 of 8 slices shown (2 of 2)]
[im 1/8]
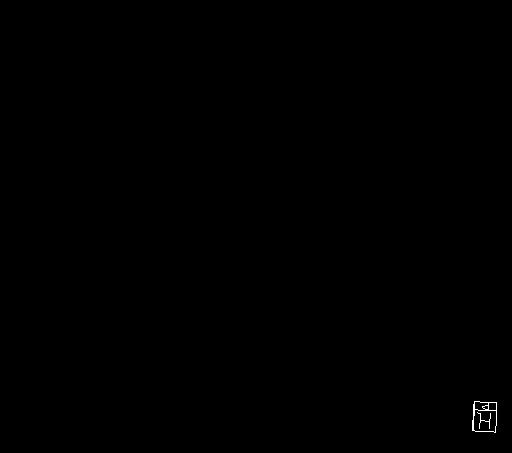
[im 2/8]
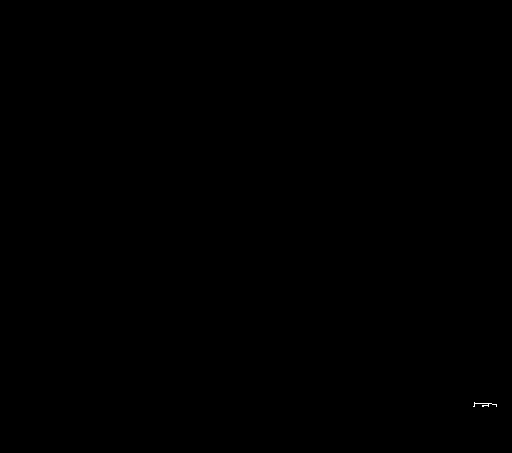
[im 3/8]
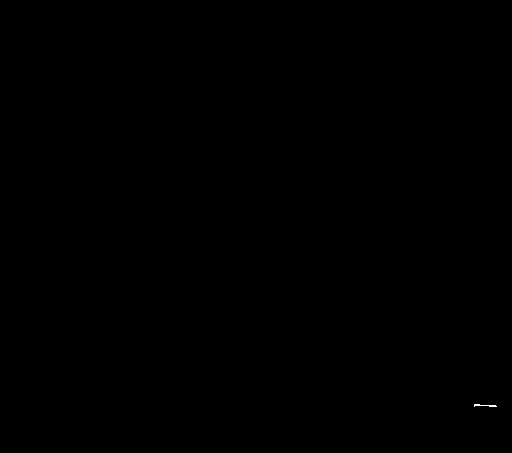
[im 4/8]
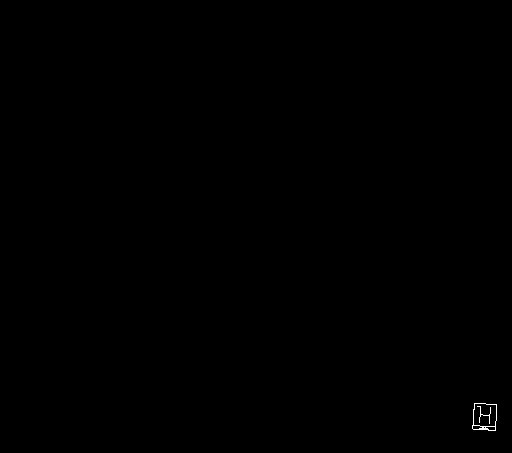
[im 5/8]
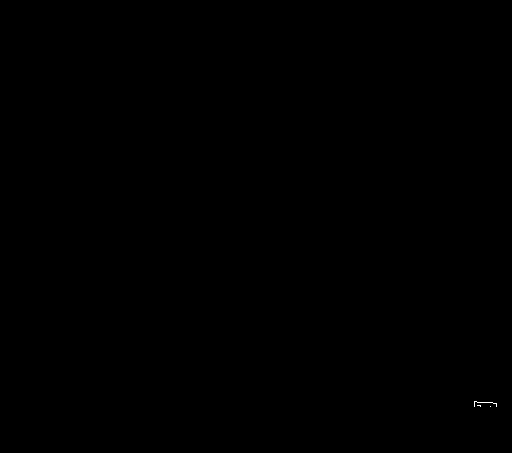
[im 7/8]
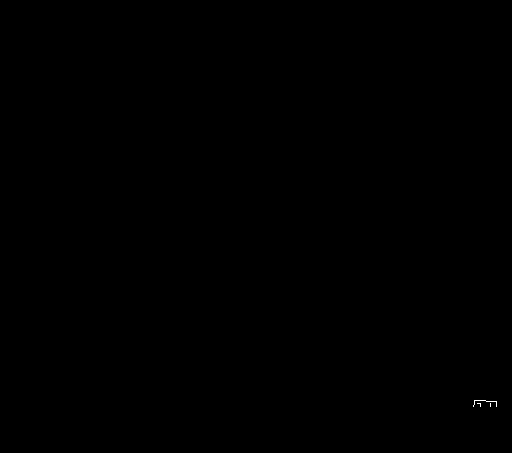

[19 of 25 positions shown; findings below may reference images not displayed]

FINDINGS: MRI HEAD FINDINGS

Brain: Cerebral volume within normal limits for age. Again seen is
mild patchy T2/FLAIR signal abnormality involving the
posterior/periatrial periventricular white matter. Few superimposed
dilated perivascular spaces noted. Appearance is relatively stable
from previous, and suspected to be chronic and possibly
developmental in nature. Finding could also be related to a
remote/perinatal insult. Additional punctate focus of FLAIR
hyperintensity noted within the subcortical white matter of the
anterior right frontal lobe (series 20, image 3), nonspecific, but
of doubtful significance in isolation. No other focal parenchymal
signal abnormality.

No abnormal foci of restricted diffusion to suggest acute or
subacute ischemia. Gray-white matter differentiation maintained. No
encephalomalacia to suggest chronic cortical infarction. No foci of
susceptibility artifact to suggest acute or chronic intracranial
hemorrhage.

No mass lesion, midline shift or mass effect. Ventricles normal size
without hydrocephalus. No extra-axial fluid collection. Pituitary
gland and suprasellar region normal. Midline structures intact and
normal.

Vascular: Major intracranial vascular flow voids are maintained.

Skull and upper cervical spine: Minimal cerebellar tonsillar ectopia
of up to 2-3 mm noted at the foramen magnum. No frank Chiari
malformation. Craniocervical junction otherwise unremarkable. Bone
marrow signal intensity within normal limits for age. No focal
marrow replacing lesion. No scalp soft tissue abnormality.

Sinuses/Orbits: Globes and orbital soft tissues within normal
limits. Paranasal sinuses are clear. No mastoid effusion. Inner ear
structures grossly normal.

Other: None.

MRA HEAD FINDINGS

Anterior circulation: Distal cervical segments of the internal
carotid arteries are widely patent with symmetric antegrade flow.
Petrous, cavernous, and supraclinoid segments widely patent without
stenosis or other abnormality. A1 segments patent bilaterally.
Normal anterior communicating artery complex. Previously question
small aneurysm is not seen on today's exam. This was likely
artifactual on prior study. Anterior cerebral arteries widely patent
distally. No M1 stenosis or occlusion. Normal MCA bifurcations.
Distal MCA branches well perfused and symmetric.

Posterior circulation: Both V4 segments widely patent to the
vertebrobasilar junction. Left vertebral artery dominant. Neither
PICA origin visualized. Basilar widely patent to its distal aspect
without stenosis. Superior cerebellar arteries patent bilaterally.
Both PCAs primarily supplied via the basilar well perfused or distal
aspects.

No intracranial aneurysm or other vascular abnormality.

Anatomic variants: None significant.
IMPRESSION: MRI HEAD IMPRESSION:

1. No acute intracranial abnormality.
2. Mild patchy T2/FLAIR signal abnormality involving the
posterior/periatrial periventricular white matter, chronic and
stable from previous. Finding could be developmental in nature or
reflect sequelae of remote/perinatal insult.
3. Otherwise normal brain MRI.

MRA HEAD IMPRESSION:

1. Normal intracranial MRA.
2. Previously questioned small aneurysm not visualized on today's
exam, likely artifactual on prior study.

## 2021-09-11 DIAGNOSIS — R519 Headache, unspecified: Secondary | ICD-10-CM | POA: Diagnosis not present

## 2021-09-11 DIAGNOSIS — F419 Anxiety disorder, unspecified: Secondary | ICD-10-CM | POA: Diagnosis not present

## 2021-09-11 DIAGNOSIS — Z6826 Body mass index (BMI) 26.0-26.9, adult: Secondary | ICD-10-CM | POA: Diagnosis not present

## 2021-12-01 ENCOUNTER — Encounter: Payer: Self-pay | Admitting: Family Medicine

## 2022-05-15 ENCOUNTER — Other Ambulatory Visit (HOSPITAL_BASED_OUTPATIENT_CLINIC_OR_DEPARTMENT_OTHER): Payer: Self-pay

## 2022-05-18 ENCOUNTER — Other Ambulatory Visit: Payer: Self-pay | Admitting: Obstetrics & Gynecology

## 2023-05-20 ENCOUNTER — Other Ambulatory Visit: Payer: Self-pay

## 2023-05-20 ENCOUNTER — Other Ambulatory Visit (HOSPITAL_COMMUNITY): Payer: Self-pay

## 2023-05-20 MED ORDER — ETONOGESTREL-ETHINYL ESTRADIOL 0.12-0.015 MG/24HR VA RING
1.0000 | VAGINAL_RING | Freq: Once | VAGINAL | 12 refills | Status: DC
  Filled 2023-05-20: qty 1, 28d supply, fill #0

## 2023-05-24 ENCOUNTER — Other Ambulatory Visit (HOSPITAL_COMMUNITY): Payer: Self-pay

## 2023-06-16 ENCOUNTER — Other Ambulatory Visit: Payer: Self-pay | Admitting: Obstetrics & Gynecology

## 2023-06-16 ENCOUNTER — Other Ambulatory Visit (HOSPITAL_COMMUNITY): Payer: Self-pay

## 2023-06-16 MED ORDER — ETONOGESTREL-ETHINYL ESTRADIOL 0.12-0.015 MG/24HR VA RING
1.0000 | VAGINAL_RING | Freq: Once | VAGINAL | 11 refills | Status: DC
  Filled 2023-06-16: qty 1, 28d supply, fill #0
  Filled 2023-07-11: qty 1, 28d supply, fill #1
  Filled 2023-08-08: qty 3, 84d supply, fill #2
  Filled 2023-10-31: qty 3, 84d supply, fill #3
  Filled 2023-12-27 – 2024-01-07 (×2): qty 3, 84d supply, fill #4
  Filled 2024-04-11: qty 1, 28d supply, fill #5

## 2023-08-09 ENCOUNTER — Other Ambulatory Visit: Payer: Self-pay

## 2023-09-30 DIAGNOSIS — S0502XA Injury of conjunctiva and corneal abrasion without foreign body, left eye, initial encounter: Secondary | ICD-10-CM | POA: Diagnosis not present

## 2023-10-06 ENCOUNTER — Other Ambulatory Visit (HOSPITAL_COMMUNITY): Payer: Self-pay

## 2023-10-06 MED ORDER — CIPROFLOXACIN HCL 500 MG PO TABS
500.0000 mg | ORAL_TABLET | Freq: Two times a day (BID) | ORAL | 0 refills | Status: DC
  Filled 2023-10-06: qty 20, 10d supply, fill #0

## 2023-11-01 ENCOUNTER — Other Ambulatory Visit (HOSPITAL_COMMUNITY): Payer: Self-pay

## 2023-12-27 ENCOUNTER — Other Ambulatory Visit (HOSPITAL_COMMUNITY): Payer: Self-pay

## 2024-03-29 ENCOUNTER — Encounter: Admitting: Obstetrics & Gynecology

## 2024-04-11 ENCOUNTER — Other Ambulatory Visit (HOSPITAL_COMMUNITY): Payer: Self-pay

## 2024-04-25 ENCOUNTER — Other Ambulatory Visit: Payer: Self-pay | Admitting: Adult Health

## 2024-04-26 ENCOUNTER — Other Ambulatory Visit (HOSPITAL_COMMUNITY): Payer: Self-pay

## 2024-04-26 ENCOUNTER — Other Ambulatory Visit (HOSPITAL_COMMUNITY)
Admission: RE | Admit: 2024-04-26 | Discharge: 2024-04-26 | Disposition: A | Discharge status: Home / Self Care | Source: Ambulatory Visit | Attending: Obstetrics and Gynecology | Admitting: Obstetrics and Gynecology

## 2024-04-26 ENCOUNTER — Ambulatory Visit (INDEPENDENT_AMBULATORY_CARE_PROVIDER_SITE_OTHER): Admitting: Obstetrics and Gynecology

## 2024-04-26 VITALS — BP 116/81 | HR 99 | Ht 63.0 in | Wt 166.0 lb

## 2024-04-26 DIAGNOSIS — Z3044 Encounter for surveillance of vaginal ring hormonal contraceptive device: Secondary | ICD-10-CM

## 2024-04-26 DIAGNOSIS — N3000 Acute cystitis without hematuria: Secondary | ICD-10-CM | POA: Diagnosis not present

## 2024-04-26 DIAGNOSIS — Z01419 Encounter for gynecological examination (general) (routine) without abnormal findings: Secondary | ICD-10-CM | POA: Diagnosis not present

## 2024-04-26 DIAGNOSIS — Z124 Encounter for screening for malignant neoplasm of cervix: Secondary | ICD-10-CM

## 2024-04-26 DIAGNOSIS — R300 Dysuria: Secondary | ICD-10-CM | POA: Diagnosis not present

## 2024-04-26 DIAGNOSIS — Z1151 Encounter for screening for human papillomavirus (HPV): Secondary | ICD-10-CM | POA: Diagnosis not present

## 2024-04-26 DIAGNOSIS — Z113 Encounter for screening for infections with a predominantly sexual mode of transmission: Secondary | ICD-10-CM

## 2024-04-26 LAB — POCT URINALYSIS DIPSTICK OB: Nitrite, UA: POSITIVE

## 2024-04-26 MED ORDER — ETONOGESTREL-ETHINYL ESTRADIOL 0.12-0.015 MG/24HR VA RING
1.0000 | VAGINAL_RING | Freq: Once | VAGINAL | 11 refills | Status: AC
  Filled 2024-04-26 – 2024-05-04 (×9): qty 1, 28d supply, fill #0
  Filled 2024-05-30: qty 1, 28d supply, fill #1
  Filled 2024-06-21 – 2024-06-22 (×2): qty 1, 28d supply, fill #2
  Filled 2024-07-22: qty 1, 28d supply, fill #3
  Filled 2024-08-22: qty 1, 28d supply, fill #4
  Filled 2024-09-20: qty 1, 28d supply, fill #5

## 2024-04-26 MED ORDER — NITROFURANTOIN MONOHYD MACRO 100 MG PO CAPS
100.0000 mg | ORAL_CAPSULE | Freq: Two times a day (BID) | ORAL | 0 refills | Status: AC
  Filled 2024-04-26: qty 14, 7d supply, fill #0

## 2024-04-26 MED ORDER — PHENAZOPYRIDINE HCL 200 MG PO TABS
200.0000 mg | ORAL_TABLET | Freq: Three times a day (TID) | ORAL | 1 refills | Status: AC | PRN
  Filled 2024-04-26: qty 10, 4d supply, fill #0

## 2024-04-26 NOTE — Progress Notes (Signed)
 ANNUAL EXAM Patient name: Brianna Banks MRN [id number]  Date of birth: 1995/12/20 Chief Complaint:   Gynecologic Exam  History of Present Illness:   Brianna Banks is a 28 y.o. G0P0000  female being seen today for a routine annual exam.  Current complaints: experiencing symptoms of UTI, dysuria, uncomfortable pressure   Uses nuvaring , had a different brand this time and she reports discomfort, pressure and irritation, once she removed she had relief.   Has not seen PCP in years   Patient's last menstrual period was 04/15/2024 (exact date).   Upstream - 04/26/24 1054       Pregnancy Intention Screening   Does the patient want to become pregnant in the next year? No    Does the patient's partner want to become pregnant in the next year? No    Would the patient like to discuss contraceptive options today? Yes      Contraception Wrap Up   Contraception Counseling Provided Yes         The pregnancy intention screening data noted above was reviewed. Potential methods of contraception were discussed. The patient elected to proceed with No data recorded.   Gynecologic History Patient's last menstrual period was . Contraception: nuvaring  Last Pap:2020 . Results were: NILM Last mammogram: n/a     04/26/2024   10:54 AM 01/22/2021    8:26 AM 04/02/2020   11:14 AM 02/01/2019   10:15 AM 12/21/2018   11:02 AM  Depression screen PHQ 2/9  Decreased Interest 0 0 0 0 0  Down, Depressed, Hopeless 0 0 0 0 1  PHQ - 2 Score 0 0 0 0 1  Altered sleeping 3  1    Tired, decreased energy 3  1    Change in appetite 0  0    Feeling bad or failure about yourself  0  0    Trouble concentrating 0  0    Moving slowly or fidgety/restless 0  0    Suicidal thoughts 0  0    PHQ-9 Score 6  2    Difficult doing work/chores   Somewhat difficult          04/26/2024   10:55 AM 06/28/2018    1:41 PM 12/09/2015    3:38 PM  GAD 7 : Generalized Anxiety Score  Nervous, Anxious, on Edge 0 3 3   Control/stop worrying 0 3 3  Worry too much - different things 0 3 3  Trouble relaxing 2 1 2   Restless 0 0 3  Easily annoyed or irritable 0 2 3  Afraid - awful might happen 0 3 3  Total GAD 7 Score 2 15 20   Anxiety Difficulty  Somewhat difficult Not difficult at all     Review of Systems:   Pertinent items are noted in HPI Denies any headaches, blurred vision, fatigue, shortness of breath, chest pain, abdominal pain, abnormal vaginal discharge/itching/odor/irritation, problems with periods, bowel movements, urination, or intercourse unless otherwise stated above. Pertinent History Reviewed:  Reviewed past medical,surgical, social and family history.  Reviewed problem list, medications and allergies. Physical Assessment:   Vitals:   04/26/24 1055  BP: 116/81  Pulse: 99  Weight: 166 lb (75.3 kg)  Height: 5' 3 (1.6 m)  Body mass index is 29.41 kg/m.        Physical Examination:   General appearance - well appearing, and in no distress  Mental status - alert, oriented   Psych:  She has a normal mood and  affect  Skin - warm and dry, normal color  Chest - effort normal, all lung fields clear to auscultation bilaterally  Heart - normal rate and regular rhythm  Neck:  midline trachea, no thyromegaly or nodules  Breasts - breasts appear normal, no suspicious masses, no skin or nipple changes or  axillary nodes  Abdomen - soft, nontender, nondistended  Pelvic - VULVA: normal appearing vulva with no masses, tenderness or lesions  VAGINA: normal appearing vagina with normal color with scant Gearline Spilman discharge, no lesions  CERVIX: normal appearing cervix without discharge or lesions, no CMT  Thin prep pap is done   Extremities:  No swelling or varicosities noted  Chaperone present for exam  Results for orders placed or performed in visit on 04/26/24 (from the past 24 hours)  POC Urinalysis Dipstick OB   Collection Time: 04/26/24 11:02 AM  Result Value Ref Range   Color, UA      Clarity, UA     Glucose, UA Trace (A) Negative   Bilirubin, UA     Ketones, UA small    Spec Grav, UA     Blood, UA moderate    pH, UA     POC,PROTEIN,UA Moderate (2+) Negative, Trace, Small (1+), Moderate (2+), Large (3+), 4+   Urobilinogen, UA     Nitrite, UA positive    Leukocytes, UA Large (3+) (A) Negative   Appearance     Odor      Assessment & Plan:   1. Encounter for annual routine gynecological examination (Primary) Annual blood work today Pap today Mammogram at 41 - Cytology - PAP( [place]) - CBC - Comp Met (CMET) - TSH Rfx on Abnormal to Free T4  2. Cervical cancer screening  - Cytology - PAP( [place])  3. Encounter for surveillance of vaginal ring hormonal contraceptive device New rx for brand name  - etonogestrel -ethinyl estradiol  (NUVARING ) 0.12-0.015 MG/24HR vaginal ring; Insert 1 ring vaginally and use as directed by your provider.  Dispense: 1 each; Refill: 11  4. Screen for STD (sexually transmitted disease)  - RPR+HBsAg+HCVAb+...  5. Acute cystitis without hematuria 6. Dysuria Leuks and nitrates on UA, send for culture. Abx sent will change if needed based on culture  - nitrofurantoin , macrocrystal-monohydrate, (MACROBID ) 100 MG capsule; Take 1 capsule (100 mg total) by mouth 2 (two) times daily.  Dispense: 14 capsule; Refill: 0 - phenazopyridine  (PYRIDIUM ) 200 MG tablet; Take 1 tablet (200 mg total) by mouth 3 (three) times daily as needed for pain (urethral spasm).  Dispense: 10 tablet; Refill: 1  - Urine Culture - POC Urinalysis Dipstick OB  Labs/procedures today:   Mammogram: @ 27yo, or sooner if problems Colonoscopy: @ 28yo, or sooner if problems  Orders Placed This Encounter  Procedures   Urine Culture   CBC   Comp Met (CMET)   TSH Rfx on Abnormal to Free T4   RPR+HBsAg+HCVAb+...   POC Urinalysis Dipstick OB    Meds:  Meds ordered this encounter  Medications   etonogestrel -ethinyl estradiol  (NUVARING ) 0.12-0.015 MG/24HR  vaginal ring    Sig: Insert 1 ring vaginally and use as directed by your provider.    Dispense:  1 each    Refill:  11    Prefers brand nuvaring  only   nitrofurantoin , macrocrystal-monohydrate, (MACROBID ) 100 MG capsule    Sig: Take 1 capsule (100 mg total) by mouth 2 (two) times daily.    Dispense:  14 capsule    Refill:  0   phenazopyridine  (  PYRIDIUM ) 200 MG tablet    Sig: Take 1 tablet (200 mg total) by mouth 3 (three) times daily as needed for pain (urethral spasm).    Dispense:  10 tablet    Refill:  1    Follow-up: Return in about 1 year (around 04/26/2025) for [place] Nidia Delores, FNP

## 2024-04-27 ENCOUNTER — Ambulatory Visit: Payer: Self-pay | Admitting: Obstetrics and Gynecology

## 2024-04-27 ENCOUNTER — Other Ambulatory Visit (HOSPITAL_COMMUNITY): Payer: Self-pay

## 2024-04-27 LAB — COMPREHENSIVE METABOLIC PANEL WITH GFR
ALT: 23 IU/L (ref 0–32)
AST: 25 IU/L (ref 0–40)
Albumin: 4 g/dL (ref 4.0–5.0)
Alkaline Phosphatase: 76 IU/L (ref 44–121)
BUN/Creatinine Ratio: 11 (ref 9–23)
BUN: 9 mg/dL (ref 6–20)
Bilirubin Total: 0.3 mg/dL (ref 0.0–1.2)
CO2: 20 mmol/L (ref 20–29)
Calcium: 9.5 mg/dL (ref 8.7–10.2)
Chloride: 104 mmol/L (ref 96–106)
Creatinine, Ser: 0.82 mg/dL (ref 0.57–1.00)
Globulin, Total: 3.6 g/dL (ref 1.5–4.5)
Glucose: 103 mg/dL — ABNORMAL HIGH (ref 70–99)
Potassium: 4.1 mmol/L (ref 3.5–5.2)
Sodium: 142 mmol/L (ref 134–144)
Total Protein: 7.6 g/dL (ref 6.0–8.5)
eGFR: 101 mL/min/1.73 (ref >59)

## 2024-04-27 LAB — TSH RFX ON ABNORMAL TO FREE T4: TSH: 2.05 u[IU]/mL (ref 0.450–4.500)

## 2024-04-27 LAB — CBC
Hematocrit: 43.8 % (ref 34.0–46.6)
Hemoglobin: 13.5 g/dL (ref 11.1–15.9)
MCH: 27.5 pg (ref 26.6–33.0)
MCHC: 30.8 g/dL — ABNORMAL LOW (ref 31.5–35.7)
MCV: 89 fL (ref 79–97)
Platelets: 235 x10E3/uL (ref 150–450)
RBC: 4.91 x10E6/uL (ref 3.77–5.28)
RDW: 12.8 % (ref 11.7–15.4)
WBC: 8.3 x10E3/uL (ref 3.4–10.8)

## 2024-04-27 LAB — RPR+HBSAG+HCVAB+...
HIV Screen 4th Generation wRfx: NONREACTIVE
Hep C Virus Ab: NONREACTIVE
Hepatitis B Surface Ag: NEGATIVE
RPR Ser Ql: NONREACTIVE

## 2024-04-28 LAB — CYTOLOGY - PAP
Chlamydia: NEGATIVE
Comment: NEGATIVE
Comment: NEGATIVE
Comment: NORMAL
Diagnosis: NEGATIVE
High risk HPV: NEGATIVE
Neisseria Gonorrhea: NEGATIVE

## 2024-04-29 LAB — URINE CULTURE

## 2024-05-03 ENCOUNTER — Other Ambulatory Visit (HOSPITAL_COMMUNITY): Payer: Self-pay

## 2024-05-03 ENCOUNTER — Encounter: Admitting: Adult Health

## 2024-05-03 ENCOUNTER — Encounter (HOSPITAL_COMMUNITY): Payer: Self-pay

## 2024-05-04 ENCOUNTER — Other Ambulatory Visit (HOSPITAL_COMMUNITY): Payer: Self-pay

## 2024-05-04 ENCOUNTER — Other Ambulatory Visit: Payer: Self-pay

## 2024-05-10 ENCOUNTER — Ambulatory Visit (INDEPENDENT_AMBULATORY_CARE_PROVIDER_SITE_OTHER): Admitting: *Deleted

## 2024-05-10 DIAGNOSIS — Z87440 Personal history of urinary (tract) infections: Secondary | ICD-10-CM

## 2024-05-10 DIAGNOSIS — R300 Dysuria: Secondary | ICD-10-CM | POA: Diagnosis not present

## 2024-05-10 NOTE — Progress Notes (Signed)
   NURSE VISIT- UTI SYMPTOMS   SUBJECTIVE:  Brianna Banks is a 28 y.o. G0P0000 female here for UTI symptoms. She is a GYN patient. She reports recently completing treatment for uti but was still having some irritation that has now resolved, pt requesting to be checked to make sure uti is gone.  OBJECTIVE:  LMP 04/15/2024 (Exact Date)   Appears well, in no apparent distress  No results found for this or any previous visit (from the past 24 hours).  ASSESSMENT: GYN with recent treatment for uti   PLAN: Note routed to Dr. Ozan   Rx sent by provider today: No Urine culture sent Call or return to clinic prn if these symptoms worsen or fail to improve as anticipated. Follow-up: as scheduled   Alan LITTIE Fischer  05/10/2024 9:28 AM

## 2024-05-12 ENCOUNTER — Other Ambulatory Visit (HOSPITAL_COMMUNITY): Payer: Self-pay

## 2024-05-12 ENCOUNTER — Ambulatory Visit: Payer: Self-pay | Admitting: Obstetrics & Gynecology

## 2024-05-12 DIAGNOSIS — N3000 Acute cystitis without hematuria: Secondary | ICD-10-CM

## 2024-05-12 MED ORDER — SULFAMETHOXAZOLE-TRIMETHOPRIM 800-160 MG PO TABS
1.0000 | ORAL_TABLET | Freq: Two times a day (BID) | ORAL | 0 refills | Status: AC
  Filled 2024-05-12: qty 6, 3d supply, fill #0

## 2024-05-14 LAB — URINE CULTURE

## 2024-05-18 ENCOUNTER — Ambulatory Visit (INDEPENDENT_AMBULATORY_CARE_PROVIDER_SITE_OTHER)

## 2024-05-18 DIAGNOSIS — Z87440 Personal history of urinary (tract) infections: Secondary | ICD-10-CM

## 2024-05-18 LAB — POCT URINALYSIS DIPSTICK OB
Glucose, UA: NEGATIVE
Ketones, UA: NEGATIVE
Nitrite, UA: NEGATIVE

## 2024-05-18 NOTE — Progress Notes (Signed)
   NURSE VISIT- UTI SYMPTOMS   SUBJECTIVE:  Brianna Banks is a 28 y.o. G0P0000 female here for UTI symptoms. She is a GYN patient. She reports No symptoms has been treated twice for UTI and wants to make sure it is gone.  OBJECTIVE:  LMP 04/15/2024 (Exact Date)   Appears well, in no apparent distress  Results for orders placed or performed in visit on 05/18/24 (from the past 24 hours)  POC Urinalysis Dipstick OB   Collection Time: 05/18/24  9:54 AM  Result Value Ref Range   Color, UA     Clarity, UA     Glucose, UA Negative Negative   Bilirubin, UA     Ketones, UA neg    Spec Grav, UA     Blood, UA Large    pH, UA     POC,PROTEIN,UA Small (1+) Negative, Trace, Small (1+), Moderate (2+), Large (3+), 4+   Urobilinogen, UA     Nitrite, UA Neg    Leukocytes, UA Trace (A) Negative   Appearance     Odor      ASSESSMENT: Gyn without symptoms and negative nitrites  PLAN: Note routed to Brianna Banks, AGNP   Rx sent by provider today: No Urine culture sent Call or return to clinic prn if these symptoms worsen or fail to improve as anticipated. Follow-up: as needed   Abrham Maslowski E Hatsue Sime  05/18/2024 9:55 AM

## 2024-05-19 LAB — MICROSCOPIC EXAMINATION
Bacteria, UA: NONE SEEN
Casts: NONE SEEN /LPF

## 2024-05-19 LAB — URINALYSIS, ROUTINE W REFLEX MICROSCOPIC
Bilirubin, UA: NEGATIVE
Glucose, UA: NEGATIVE
Ketones, UA: NEGATIVE
Leukocytes,UA: NEGATIVE
Nitrite, UA: NEGATIVE
Specific Gravity, UA: 1.014 (ref 1.005–1.030)
Urobilinogen, Ur: 0.2 mg/dL (ref 0.2–1.0)
pH, UA: 6 (ref 5.0–7.5)

## 2024-05-20 LAB — URINE CULTURE

## 2024-05-22 ENCOUNTER — Ambulatory Visit: Payer: Self-pay | Admitting: Adult Health

## 2024-05-30 ENCOUNTER — Other Ambulatory Visit (HOSPITAL_COMMUNITY): Payer: Self-pay

## 2024-06-09 ENCOUNTER — Other Ambulatory Visit (HOSPITAL_BASED_OUTPATIENT_CLINIC_OR_DEPARTMENT_OTHER): Payer: Self-pay

## 2024-06-20 ENCOUNTER — Other Ambulatory Visit: Payer: Self-pay

## 2024-06-21 ENCOUNTER — Other Ambulatory Visit (HOSPITAL_COMMUNITY): Payer: Self-pay

## 2024-06-22 ENCOUNTER — Other Ambulatory Visit (HOSPITAL_COMMUNITY): Payer: Self-pay

## 2024-06-23 ENCOUNTER — Other Ambulatory Visit (HOSPITAL_COMMUNITY): Payer: Self-pay

## 2024-07-10 ENCOUNTER — Other Ambulatory Visit (HOSPITAL_COMMUNITY): Payer: Self-pay

## 2024-07-10 ENCOUNTER — Telehealth: Payer: Self-pay | Admitting: Internal Medicine

## 2024-07-10 MED ORDER — SUMATRIPTAN SUCCINATE 50 MG PO TABS
50.0000 mg | ORAL_TABLET | Freq: Once | ORAL | 1 refills | Status: AC | PRN
  Filled 2024-07-10: qty 18, 30d supply, fill #0

## 2024-07-10 NOTE — Telephone Encounter (Signed)
 Known migraine diagnosis.  Currently occurring more frequently. Trial of imitrex , f/u with neuro if not improved.

## 2024-07-22 ENCOUNTER — Other Ambulatory Visit (HOSPITAL_COMMUNITY): Payer: Self-pay

## 2024-07-24 ENCOUNTER — Other Ambulatory Visit: Payer: Self-pay

## 2024-07-24 ENCOUNTER — Other Ambulatory Visit (HOSPITAL_COMMUNITY): Payer: Self-pay

## 2024-07-25 ENCOUNTER — Other Ambulatory Visit (HOSPITAL_COMMUNITY): Payer: Self-pay

## 2024-07-26 ENCOUNTER — Other Ambulatory Visit (HOSPITAL_COMMUNITY): Payer: Self-pay

## 2024-08-03 ENCOUNTER — Other Ambulatory Visit: Payer: Self-pay

## 2024-08-07 ENCOUNTER — Other Ambulatory Visit (HOSPITAL_COMMUNITY): Payer: Self-pay

## 2024-08-07 ENCOUNTER — Other Ambulatory Visit (HOSPITAL_BASED_OUTPATIENT_CLINIC_OR_DEPARTMENT_OTHER): Payer: Self-pay

## 2024-08-07 ENCOUNTER — Encounter (HOSPITAL_BASED_OUTPATIENT_CLINIC_OR_DEPARTMENT_OTHER): Payer: Self-pay

## 2024-08-07 DIAGNOSIS — R739 Hyperglycemia, unspecified: Secondary | ICD-10-CM | POA: Diagnosis not present

## 2024-08-07 DIAGNOSIS — E663 Overweight: Secondary | ICD-10-CM | POA: Diagnosis not present

## 2024-08-07 DIAGNOSIS — R5383 Other fatigue: Secondary | ICD-10-CM | POA: Diagnosis not present

## 2024-08-07 MED ORDER — PHENTERMINE HCL 37.5 MG PO TABS
37.5000 mg | ORAL_TABLET | Freq: Every morning | ORAL | 2 refills | Status: AC
  Filled 2024-08-07 (×2): qty 30, 30d supply, fill #0
  Filled 2024-09-06: qty 30, 30d supply, fill #1

## 2024-08-07 MED ORDER — PHENTERMINE HCL 37.5 MG PO TABS
37.5000 mg | ORAL_TABLET | Freq: Every morning | ORAL | 2 refills | Status: AC
  Filled 2024-08-07: qty 30, 30d supply, fill #0

## 2024-08-22 ENCOUNTER — Other Ambulatory Visit: Payer: Self-pay

## 2024-08-22 ENCOUNTER — Other Ambulatory Visit (HOSPITAL_BASED_OUTPATIENT_CLINIC_OR_DEPARTMENT_OTHER): Payer: Self-pay

## 2024-09-06 ENCOUNTER — Other Ambulatory Visit (HOSPITAL_BASED_OUTPATIENT_CLINIC_OR_DEPARTMENT_OTHER): Payer: Self-pay

## 2024-09-20 ENCOUNTER — Other Ambulatory Visit: Payer: Self-pay

## 2024-09-20 ENCOUNTER — Other Ambulatory Visit (HOSPITAL_COMMUNITY): Payer: Self-pay

## 2024-09-24 ENCOUNTER — Other Ambulatory Visit: Payer: Self-pay
